# Patient Record
Sex: Female | Born: 1980
Health system: Southern US, Community
[De-identification: ages and names within clinical notes are randomized; demographics above are authoritative.]

## PROBLEM LIST (undated history)

## (undated) ENCOUNTER — Ambulatory Visit: Payer: BC Managed Care – PPO

## (undated) DIAGNOSIS — Z862 Personal history of diseases of the blood and blood-forming organs and certain disorders involving the immune mechanism: Secondary | ICD-10-CM

## (undated) DIAGNOSIS — J45909 Unspecified asthma, uncomplicated: Secondary | ICD-10-CM

## (undated) DIAGNOSIS — B019 Varicella without complication: Secondary | ICD-10-CM

## (undated) HISTORY — DX: Personal history of diseases of the blood and blood-forming organs and certain disorders involving the immune mechanism: Z86.2

## (undated) HISTORY — DX: Varicella without complication: B01.9

## (undated) HISTORY — DX: Unspecified asthma, uncomplicated: J45.909

---

## 1999-08-07 ENCOUNTER — Emergency Department (HOSPITAL_COMMUNITY): Admission: EM | Admit: 1999-08-07 | Discharge: 1999-08-07 | Payer: Self-pay | Admitting: Internal Medicine

## 2000-06-19 ENCOUNTER — Ambulatory Visit (HOSPITAL_BASED_OUTPATIENT_CLINIC_OR_DEPARTMENT_OTHER): Admission: RE | Admit: 2000-06-19 | Discharge: 2000-06-19 | Payer: Self-pay | Admitting: Family Medicine

## 2000-09-13 ENCOUNTER — Encounter: Admission: RE | Admit: 2000-09-13 | Discharge: 2000-12-12 | Payer: Self-pay | Admitting: Internal Medicine

## 2003-02-13 ENCOUNTER — Emergency Department (HOSPITAL_COMMUNITY): Admission: EM | Admit: 2003-02-13 | Discharge: 2003-02-13 | Payer: Self-pay | Admitting: Emergency Medicine

## 2003-10-12 ENCOUNTER — Emergency Department (HOSPITAL_COMMUNITY): Admission: EM | Admit: 2003-10-12 | Discharge: 2003-10-12 | Payer: Self-pay | Admitting: Emergency Medicine

## 2004-03-10 ENCOUNTER — Emergency Department (HOSPITAL_COMMUNITY): Admission: EM | Admit: 2004-03-10 | Discharge: 2004-03-10 | Payer: Self-pay | Admitting: Emergency Medicine

## 2005-05-20 ENCOUNTER — Other Ambulatory Visit: Admission: RE | Admit: 2005-05-20 | Discharge: 2005-05-20 | Payer: Self-pay | Admitting: Family Medicine

## 2006-11-18 ENCOUNTER — Emergency Department (HOSPITAL_COMMUNITY): Admission: EM | Admit: 2006-11-18 | Discharge: 2006-11-19 | Payer: Self-pay | Admitting: Emergency Medicine

## 2009-01-15 ENCOUNTER — Encounter: Admission: RE | Admit: 2009-01-15 | Discharge: 2009-02-12 | Payer: Self-pay | Admitting: Emergency Medicine

## 2009-10-14 ENCOUNTER — Encounter: Admission: RE | Admit: 2009-10-14 | Discharge: 2010-01-12 | Payer: Self-pay | Admitting: Emergency Medicine

## 2010-03-16 ENCOUNTER — Encounter: Admission: RE | Admit: 2010-03-16 | Discharge: 2010-03-16 | Payer: Self-pay | Admitting: Emergency Medicine

## 2015-07-29 ENCOUNTER — Encounter (INDEPENDENT_AMBULATORY_CARE_PROVIDER_SITE_OTHER): Payer: Self-pay

## 2015-07-29 ENCOUNTER — Encounter: Payer: Self-pay | Admitting: Internal Medicine

## 2015-07-29 ENCOUNTER — Ambulatory Visit (INDEPENDENT_AMBULATORY_CARE_PROVIDER_SITE_OTHER): Payer: BC Managed Care – PPO | Admitting: Internal Medicine

## 2015-07-29 VITALS — BP 130/82 | HR 78 | Temp 98.2°F | Ht 64.0 in | Wt 358.0 lb

## 2015-07-29 DIAGNOSIS — L732 Hidradenitis suppurativa: Secondary | ICD-10-CM | POA: Diagnosis not present

## 2015-07-29 DIAGNOSIS — J4520 Mild intermittent asthma, uncomplicated: Secondary | ICD-10-CM | POA: Diagnosis not present

## 2015-07-29 DIAGNOSIS — J45909 Unspecified asthma, uncomplicated: Secondary | ICD-10-CM | POA: Insufficient documentation

## 2015-07-29 NOTE — Progress Notes (Signed)
Pre visit review using our clinic review tool, if applicable. No additional management support is needed unless otherwise documented below in the visit note. 

## 2015-07-29 NOTE — Progress Notes (Signed)
HPI  Pt presents to the clinic today to establish care and for management of the conditions listed below. She has not had a PCP in many years but has been getting care from [facility] urgent care.  Allergy induced asthma: Worse in the Spring. She'll take an antihistamine when the seasons change. She does not have an inhaler.  Hidradennitis Suppurative: She gets cysts under her breast and in her groin. She is supposed to be on daily suppressive therapy with Septra but she reports she does not take it daily.  Obesity: Her BMI is 61.45. She is not working on diet or exercise at this time. Earlier in the year, she was working with [facility] on weight loss, but reports it is harder for her to make appts there since she moved.  Flu: never Tetanus: unsure Pap Smear: 2014- normal Dentist: as needed  Past Medical History  Diagnosis Date  . Allergy-induced asthma   . Chicken pox   . History of anemia     Current Outpatient Prescriptions  Medication Sig Dispense Refill  . sulfamethoxazole-trimethoprim (BACTRIM DS,SEPTRA DS) 800-160 MG tablet Take 1 tablet by mouth 2 (two) times daily. 1 tablet daily and 2 times daily week of menses     No current facility-administered medications for this visit.    Allergies  Allergen Reactions  . Penicillins Hives    Family History  Problem Relation Age of Onset  . Heart disease Mother   . Stroke Mother   . Hypertension Mother   . Heart disease Father   . Stroke Father   . Arthritis Paternal Grandfather     Social History   Social History  . Marital Status: Divorced    Spouse Name: N/A  . Number of Children: N/A  . Years of Education: N/A   Occupational History  . Not on file.   Social History Main Topics  . Smoking status: Never Smoker   . Smokeless tobacco: Never Used  . Alcohol Use: No  . Drug Use: No  . Sexual Activity: Not on file   Other Topics Concern  . Not on file   Social History Narrative  . No  narrative on file    ROS:  Constitutional: Denies fever, malaise, fatigue, headache or abrupt weight changes.  HEENT: Denies eye pain, eye redness, ear pain, ringing in the ears, wax buildup, runny nose, nasal congestion, bloody nose, or sore throat. Respiratory: Denies difficulty breathing, shortness of breath, cough or sputum production.   Cardiovascular: Denies chest pain, chest tightness, palpitations or swelling in the hands or feet.  Skin: Denies redness, rashes, lesions or ulcercations.  Neurological: Denies dizziness, difficulty with memory, difficulty with speech or problems with balance and coordination.  Psych: Denies anxiety, depression, SI/HI.  No other specific complaints in a complete review of systems (except as listed in HPI above).  PE:  BP 130/82 mmHg  Pulse 78  Temp(Src) 98.2 F (36.8 C) (Oral)  Ht  (1.626 m)  Wt 358 lb (162.388 kg)  BMI 61.42 kg/m2  SpO2 99%  LMP 07/21/2015 Wt Readings from Last 3 Encounters:  07/29/15 358 lb (162.388 kg)    General: Appears her stated age, obese in NAD. HEENT: Head: normal shape and size; Eyes: sclera white, no icterus, conjunctiva pink, PERRLA and EOMs intact; Ears: Tm's gray and intact, normal light reflex;Throat/Mouth: Teeth present, mucosa pink and moist, no lesions or ulcerations noted.   Cardiovascular: Normal rate and rhythm. S1,S2 noted.  No murmur, rubs  or gallops noted.  Pulmonary/Chest: Normal effort and positive vesicular breath sounds. No respiratory distress. No wheezes, rales or ronchi noted.  Neurological: Alert and oriented.  Psychiatric: Mood and affect normal. Behavior is normal. Judgment and thought content normal.    Assessment and Plan:  Allergy induced asthma:  Encouraged her to continue antihistamines prn when the seasons change She declines RX for Albuterol to have on hand if needed  Hidradenitis suppurative:  Encouraged her to take the Septra daily as prescribed She will let me know  when this flares up  Obesity:  Encouraged her to work on diet and exercise Will check A1C with annual labs  Make an appt for your annual exam

## 2015-07-29 NOTE — Patient Instructions (Signed)

## 2015-08-28 ENCOUNTER — Ambulatory Visit (INDEPENDENT_AMBULATORY_CARE_PROVIDER_SITE_OTHER): Payer: BC Managed Care – PPO | Admitting: Internal Medicine

## 2015-08-28 ENCOUNTER — Encounter: Payer: Self-pay | Admitting: Internal Medicine

## 2015-08-28 VITALS — BP 126/80 | HR 79 | Temp 98.2°F | Ht 64.0 in | Wt 361.0 lb

## 2015-08-28 DIAGNOSIS — Z0000 Encounter for general adult medical examination without abnormal findings: Secondary | ICD-10-CM | POA: Diagnosis not present

## 2015-08-28 LAB — CBC
HCT: 40.4 % (ref 36.0–46.0)
Hemoglobin: 12.7 g/dL (ref 12.0–15.0)
MCHC: 31.4 g/dL (ref 30.0–36.0)
MCV: 79.7 fl (ref 78.0–100.0)
PLATELETS: 443 10*3/uL — AB (ref 150.0–400.0)
RBC: 5.06 Mil/uL (ref 3.87–5.11)
RDW: 14.7 % (ref 11.5–15.5)
WBC: 6.2 10*3/uL (ref 4.0–10.5)

## 2015-08-28 LAB — COMPREHENSIVE METABOLIC PANEL
ALBUMIN: 3.5 g/dL (ref 3.5–5.2)
ALT: 14 U/L (ref 0–35)
AST: 17 U/L (ref 0–37)
Alkaline Phosphatase: 79 U/L (ref 39–117)
BILIRUBIN TOTAL: 0.4 mg/dL (ref 0.2–1.2)
BUN: 11 mg/dL (ref 6–23)
CALCIUM: 9 mg/dL (ref 8.4–10.5)
CO2: 27 mEq/L (ref 19–32)
CREATININE: 0.79 mg/dL (ref 0.40–1.20)
Chloride: 105 mEq/L (ref 96–112)
GFR: 106.31 mL/min (ref >60.00)
Glucose, Bld: 86 mg/dL (ref 70–99)
Potassium: 3.9 mEq/L (ref 3.5–5.1)
SODIUM: 136 meq/L (ref 135–145)
Total Protein: 7.9 g/dL (ref 6.0–8.3)

## 2015-08-28 LAB — LIPID PANEL
CHOLESTEROL: 137 mg/dL (ref 0–200)
HDL: 42.4 mg/dL (ref >39.00)
LDL CALC: 87 mg/dL (ref 0–99)
NONHDL: 94.82
Total CHOL/HDL Ratio: 3
Triglycerides: 40 mg/dL (ref 0.0–149.0)
VLDL: 8 mg/dL (ref 0.0–40.0)

## 2015-08-28 LAB — HEMOGLOBIN A1C: HEMOGLOBIN A1C: 6.1 % (ref 4.6–6.5)

## 2015-08-28 LAB — TSH: TSH: 1.44 u[IU]/mL (ref 0.35–4.50)

## 2015-08-28 LAB — HIV ANTIBODY (ROUTINE TESTING W REFLEX): HIV: NONREACTIVE

## 2015-08-28 NOTE — Patient Instructions (Signed)
Health Maintenance, Female Adopting a healthy lifestyle and getting preventive care can go a long way to promote health and wellness. Talk with your health care provider about what schedule of regular examinations is right for you. This is a good chance for you to check in with your provider about disease prevention and staying healthy. In between checkups, there are plenty of things you can do on your own. Experts have done a lot of research about which lifestyle changes and preventive measures are most likely to keep you healthy. Ask your health care provider for more information. WEIGHT AND DIET  Eat a healthy diet  Be sure to include plenty of vegetables, fruits, low-fat dairy products, and lean protein.  Do not eat a lot of foods high in solid fats, added sugars, or salt.  Get regular exercise. This is one of the most important things you can do for your health.  Most adults should exercise for at least 150 minutes each week. The exercise should increase your heart rate and make you sweat (moderate-intensity exercise).  Most adults should also do strengthening exercises at least twice a week. This is in addition to the moderate-intensity exercise.  Maintain a healthy weight  Body mass index (BMI) is a measurement that can be used to identify possible weight problems. It estimates body fat based on height and weight. Your health care provider can help determine your BMI and help you achieve or maintain a healthy weight.  For females 20 years of age and older:   A BMI below 18.5 is considered underweight.  A BMI of 18.5 to 24.9 is normal.  A BMI of 25 to 29.9 is considered overweight.  A BMI of 30 and above is considered obese.  Watch levels of cholesterol and blood lipids  You should start having your blood tested for lipids and cholesterol at 35 years of age, then have this test every 5 years.  You may need to have your cholesterol levels checked more often if:  Your lipid  or cholesterol levels are high.  You are older than 35 years of age.  You are at high risk for heart disease.  CANCER SCREENING   Lung Cancer  Lung cancer screening is recommended for adults 55-80 years old who are at high risk for lung cancer because of a history of smoking.  A yearly low-dose CT scan of the lungs is recommended for people who:  Currently smoke.  Have quit within the past 15 years.  Have at least a 30-pack-year history of smoking. A pack year is smoking an average of one pack of cigarettes a day for 1 year.  Yearly screening should continue until it has been 15 years since you quit.  Yearly screening should stop if you develop a health problem that would prevent you from having lung cancer treatment.  Breast Cancer  Practice breast self-awareness. This means understanding how your breasts normally appear and feel.  It also means doing regular breast self-exams. Let your health care provider know about any changes, no matter how small.  If you are in your 20s or 30s, you should have a clinical breast exam (CBE) by a health care provider every 1-3 years as part of a regular health exam.  If you are 40 or older, have a CBE every year. Also consider having a breast X-ray (mammogram) every year.  If you have a family history of breast cancer, talk to your health care provider about genetic screening.  If you   are at high risk for breast cancer, talk to your health care provider about having an MRI and a mammogram every year.  Breast cancer gene (BRCA) assessment is recommended for women who have family members with BRCA-related cancers. BRCA-related cancers include:  Breast.  Ovarian.  Tubal.  Peritoneal cancers.  Results of the assessment will determine the need for genetic counseling and BRCA1 and BRCA2 testing. Cervical Cancer Your health care provider may recommend that you be screened regularly for cancer of the pelvic organs (ovaries, uterus, and  vagina). This screening involves a pelvic examination, including checking for microscopic changes to the surface of your cervix (Pap test). You may be encouraged to have this screening done every 3 years, beginning at age 21.  For women ages 30-65, health care providers may recommend pelvic exams and Pap testing every 3 years, or they may recommend the Pap and pelvic exam, combined with testing for human papilloma virus (HPV), every 5 years. Some types of HPV increase your risk of cervical cancer. Testing for HPV may also be done on women of any age with unclear Pap test results.  Other health care providers may not recommend any screening for nonpregnant women who are considered low risk for pelvic cancer and who do not have symptoms. Ask your health care provider if a screening pelvic exam is right for you.  If you have had past treatment for cervical cancer or a condition that could lead to cancer, you need Pap tests and screening for cancer for at least 20 years after your treatment. If Pap tests have been discontinued, your risk factors (such as having a new sexual partner) need to be reassessed to determine if screening should resume. Some women have medical problems that increase the chance of getting cervical cancer. In these cases, your health care provider may recommend more frequent screening and Pap tests. Colorectal Cancer  This type of cancer can be detected and often prevented.  Routine colorectal cancer screening usually begins at 35 years of age and continues through 35 years of age.  Your health care provider may recommend screening at an earlier age if you have risk factors for colon cancer.  Your health care provider may also recommend using home test kits to check for hidden blood in the stool.  A small camera at the end of a tube can be used to examine your colon directly (sigmoidoscopy or colonoscopy). This is done to check for the earliest forms of colorectal  cancer.  Routine screening usually begins at age 50.  Direct examination of the colon should be repeated every 5-10 years through 35 years of age. However, you may need to be screened more often if early forms of precancerous polyps or small growths are found. Skin Cancer  Check your skin from head to toe regularly.  Tell your health care provider about any new moles or changes in moles, especially if there is a change in a mole's shape or color.  Also tell your health care provider if you have a mole that is larger than the size of a pencil eraser.  Always use sunscreen. Apply sunscreen liberally and repeatedly throughout the day.  Protect yourself by wearing long sleeves, pants, a wide-brimmed hat, and sunglasses whenever you are outside. HEART DISEASE, DIABETES, AND HIGH BLOOD PRESSURE   High blood pressure causes heart disease and increases the risk of stroke. High blood pressure is more likely to develop in:  People who have blood pressure in the high end   of the normal range (130-139/85-89 mm Hg).  People who are overweight or obese.  People who are African American.  If you are 38-23 years of age, have your blood pressure checked every 3-5 years. If you are 61 years of age or older, have your blood pressure checked every year. You should have your blood pressure measured twice--once when you are at a hospital or clinic, and once when you are not at a hospital or clinic. Record the average of the two measurements. To check your blood pressure when you are not at a hospital or clinic, you can use:  An automated blood pressure machine at a pharmacy.  A home blood pressure monitor.  If you are between 45 years and 39 years old, ask your health care provider if you should take aspirin to prevent strokes.  Have regular diabetes screenings. This involves taking a blood sample to check your fasting blood sugar level.  If you are at a normal weight and have a low risk for diabetes,  have this test once every three years after 35 years of age.  If you are overweight and have a high risk for diabetes, consider being tested at a younger age or more often. PREVENTING INFECTION  Hepatitis B  If you have a higher risk for hepatitis B, you should be screened for this virus. You are considered at high risk for hepatitis B if:  You were born in a country where hepatitis B is common. Ask your health care provider which countries are considered high risk.  Your parents were born in a high-risk country, and you have not been immunized against hepatitis B (hepatitis B vaccine).  You have HIV or AIDS.  You use needles to inject street drugs.  You live with someone who has hepatitis B.  You have had sex with someone who has hepatitis B.  You get hemodialysis treatment.  You take certain medicines for conditions, including cancer, organ transplantation, and autoimmune conditions. Hepatitis C  Blood testing is recommended for:  Everyone born from [birth year] through 1966.  Anyone with known risk factors for hepatitis C. Sexually transmitted infections (STIs)  You should be screened for sexually transmitted infections (STIs) including gonorrhea and chlamydia if:  You are sexually active and are younger than 35 years of age.  You are older than 35 years of age and your health care provider tells you that you are at risk for this type of infection.  Your sexual activity has changed since you were last screened and you are at an increased risk for chlamydia or gonorrhea. Ask your health care provider if you are at risk.  If you do not have HIV, but are at risk, it may be recommended that you take a prescription medicine daily to prevent HIV infection. This is called pre-exposure prophylaxis (PrEP). You are considered at risk if:  You are sexually active and do not regularly use condoms or know the HIV status of your partner(s).  You take drugs by injection.  You are sexually  active with a partner who has HIV. Talk with your health care provider about whether you are at high risk of being infected with HIV. If you choose to begin PrEP, you should first be tested for HIV. You should then be tested every 3 months for as long as you are taking PrEP.  PREGNANCY   If you are premenopausal and you may become pregnant, ask your health care provider about preconception counseling.  If you may  become pregnant, take 400 to 800 micrograms (mcg) of folic acid every day.  If you want to prevent pregnancy, talk to your health care provider about birth control (contraception). OSTEOPOROSIS AND MENOPAUSE   Osteoporosis is a disease in which the bones lose minerals and strength with aging. This can result in serious bone fractures. Your risk for osteoporosis can be identified using a bone density scan.  If you are 61 years of age or older, or if you are at risk for osteoporosis and fractures, ask your health care provider if you should be screened.  Ask your health care provider whether you should take a calcium or vitamin D supplement to lower your risk for osteoporosis.  Menopause may have certain physical symptoms and risks.  Hormone replacement therapy may reduce some of these symptoms and risks. Talk to your health care provider about whether hormone replacement therapy is right for you.  HOME CARE INSTRUCTIONS   Schedule regular health, dental, and eye exams.  Stay current with your immunizations.   Do not use any tobacco products including cigarettes, chewing tobacco, or electronic cigarettes.  If you are pregnant, do not drink alcohol.  If you are breastfeeding, limit how much and how often you drink alcohol.  Limit alcohol intake to no more than 1 drink per day for nonpregnant women. One drink equals 12 ounces of beer, 5 ounces of wine, or 1 ounces of hard liquor.  Do not use street drugs.  Do not share needles.  Ask your health care provider for help if  you need support or information about quitting drugs.  Tell your health care provider if you often feel depressed.  Tell your health care provider if you have ever been abused or do not feel safe at home.   This information is not intended to replace advice given to you by your health care provider. Make sure you discuss any questions you have with your health care provider.   Document Released: 03/08/2011 Document Revised: 09/13/2014 Document Reviewed: 07/25/2013 Elsevier Interactive Patient Education [employer].

## 2015-08-28 NOTE — Progress Notes (Signed)
Pre visit review using our clinic review tool, if applicable. No additional management support is needed unless otherwise documented below in the visit note. 

## 2015-08-28 NOTE — Progress Notes (Signed)
Subjective:    Patient ID: Shelia Diaz, female    DOB: [birth year], 34 y.o.   MRN: [id number]  HPI  Pt presents to the clinic today for her annual exam.  Flu: 06/2015 Tetanus: unsure Pap Smear: 2014- normal Dentist: as needed  Diet: She does eat meat. She consumes fruits and veggies a few days per week. She tries to avoid fried foods. She drinks mostly water, flavored water or diet soda. Exercise: None  Review of Systems      Past Medical History  Diagnosis Date  . Allergy-induced asthma   . Chicken pox   . History of anemia     Current Outpatient Prescriptions  Medication Sig Dispense Refill  . sulfamethoxazole-trimethoprim (BACTRIM DS,SEPTRA DS) 800-160 MG tablet Take 1 tablet by mouth 2 (two) times daily. 1 tablet daily and 2 times daily week of menses     No current facility-administered medications for this visit.    Allergies  Allergen Reactions  . Penicillins Hives    Family History  Problem Relation Age of Onset  . Heart disease Mother   . Stroke Mother   . Hypertension Mother   . Heart disease Father   . Stroke Father   . Arthritis Paternal Grandfather     Social History   Social History  . Marital Status: Divorced    Spouse Name: N/A  . Number of Children: N/A  . Years of Education: N/A   Occupational History  . Not on file.   Social History Main Topics  . Smoking status: Never Smoker   . Smokeless tobacco: Never Used  . Alcohol Use: No  . Drug Use: No  . Sexual Activity: Yes   Other Topics Concern  . Not on file   Social History Narrative     Constitutional: Denies fever, malaise, fatigue, headache or abrupt weight changes.  HEENT: Denies eye pain, eye redness, ear pain, ringing in the ears, wax buildup, runny nose, nasal congestion, bloody nose, or sore throat. Respiratory: Denies difficulty breathing, shortness of breath, cough or sputum production.   Cardiovascular: Denies chest pain, chest tightness, palpitations or  swelling in the hands or feet.  Gastrointestinal: Denies abdominal pain, bloating, constipation, diarrhea or blood in the stool.  GU: Denies urgency, frequency, pain with urination, burning sensation, blood in urine, odor or discharge. Musculoskeletal: Denies decrease in range of motion, difficulty with gait, muscle pain or joint pain and swelling.  Skin: Pt reports hidradenitis. Denies redness, rashes or ulcercations.  Neurological: Denies dizziness, difficulty with memory, difficulty with speech or problems with balance and coordination.  Psych: Denies anxiety, depression, SI/HI.  No other specific complaints in a complete review of systems (except as listed in HPI above).  Objective:   Physical Exam   BP 126/80 mmHg  Pulse 79  Temp(Src) 98.2 F (36.8 C) (Oral)  Ht  (1.626 m)  Wt 361 lb (163.749 kg)  BMI 61.94 kg/m2  SpO2 98%  LMP 08/21/2015 Wt Readings from Last 3 Encounters:  08/28/15 361 lb (163.749 kg)  07/29/15 358 lb (162.388 kg)    General: Appears her stated age, obese in NAD. Skin: Warm, dry and intact.  HEENT: Head: normal shape and size; Eyes: sclera white, no icterus, conjunctiva pink, PERRLA and EOMs intact; Ears: Tm's gray and intact, normal light reflex; Throat/Mouth: Teeth present, mucosa pink and moist, no exudate, lesions or ulcerations noted.  Neck:  Neck supple, trachea midline. No masses, lumps or thyromegaly present.  Cardiovascular: Normal rate  and rhythm. S1,S2 noted.  No murmur, rubs or gallops noted.  Pulmonary/Chest: Normal effort and positive vesicular breath sounds. No respiratory distress. No wheezes, rales or ronchi noted.  Abdomen: Soft and nontender. Normal bowel sounds. No distention or masses noted. Liver, spleen and kidneys non palpable. Musculoskeletal: Strength 5/5 BUE/BLE. No signs of joint swelling. No difficulty with gait.  Neurological: Alert and oriented. Cranial nerves II-XII grossly  intact. Coordination normal.  Psychiatric:  Mood and affect normal. Behavior is normal. Judgment and thought content normal.       Assessment & Plan:   Preventative Health Maintenance:  Flu shot UTD Tetanus today Encouraged her to consume a balanced diet and start an exercise regimen Pap smear due 2017 Encouraged her to see a dentist at least annually Will check CBC, CMET, TSH, Lipid, A1C and HIV  RTC in 1 year or sooner if needed

## 2015-10-01 ENCOUNTER — Other Ambulatory Visit: Payer: Self-pay | Admitting: Physician Assistant

## 2015-10-01 DIAGNOSIS — K449 Diaphragmatic hernia without obstruction or gangrene: Secondary | ICD-10-CM

## 2015-10-07 ENCOUNTER — Other Ambulatory Visit: Payer: BC Managed Care – PPO

## 2015-11-17 ENCOUNTER — Ambulatory Visit
Admission: RE | Admit: 2015-11-17 | Discharge: 2015-11-17 | Disposition: A | Discharge status: Home / Self Care | Payer: BC Managed Care – PPO | Source: Ambulatory Visit | Attending: Physician Assistant | Admitting: Physician Assistant

## 2015-11-17 DIAGNOSIS — K449 Diaphragmatic hernia without obstruction or gangrene: Secondary | ICD-10-CM

## 2016-03-16 HISTORY — PX: LAPAROSCOPIC GASTRIC SLEEVE RESECTION: SHX5895

## 2016-05-14 ENCOUNTER — Encounter: Payer: Self-pay | Admitting: Internal Medicine

## 2016-05-14 ENCOUNTER — Ambulatory Visit (INDEPENDENT_AMBULATORY_CARE_PROVIDER_SITE_OTHER): Payer: BC Managed Care – PPO | Admitting: Internal Medicine

## 2016-05-14 VITALS — BP 122/70 | HR 78 | Temp 98.6°F | Wt 316.5 lb

## 2016-05-14 DIAGNOSIS — T162XXA Foreign body in left ear, initial encounter: Secondary | ICD-10-CM

## 2016-05-14 NOTE — Progress Notes (Signed)
Subjective:    Patient ID: Shelia Diaz, female    DOB: [birth year], 35 y.o.   MRN: [id number]  HPI  Pt presents to the clinic today with c/o having a tip of a Qtip stuck in her left ear. She reports she was itching her ear with the Qtip yesterday, and when she pulled it out, the cotton was not on the Qtip. This morning, she woke up and was not able to hear out of her left ear. She has not tried anything OTC for this.   Review of Systems      Past Medical History:  Diagnosis Date  . Allergy-induced asthma   . Chicken pox   . History of anemia     Current Outpatient Prescriptions  Medication Sig Dispense Refill  . sulfamethoxazole-trimethoprim (BACTRIM DS,SEPTRA DS) 800-160 MG tablet Take 1 tablet by mouth as needed.    . ursodiol (ACTIGALL) 300 MG capsule Take 300 mg by mouth 3 (three) times daily.     No current facility-administered medications for this visit.     Allergies  Allergen Reactions  . Penicillins Hives    Family History  Problem Relation Age of Onset  . Heart disease Mother   . Stroke Mother   . Hypertension Mother   . Heart disease Father   . Stroke Father   . Arthritis Paternal Grandfather     Social History   Social History  . Marital status: Divorced    Spouse name: N/A  . Number of children: N/A  . Years of education: N/A   Occupational History  . Not on file.   Social History Main Topics  . Smoking status: Never Smoker  . Smokeless tobacco: Never Used  . Alcohol use No  . Drug use: No  . Sexual activity: Yes   Other Topics Concern  . Not on file   Social History Narrative  . No narrative on file     Constitutional: Denies fever, malaise, fatigue, headache or abrupt weight changes.  HEENT: Pt reports foreign object in left ear. Denies eye pain, eye redness, ear pain, ringing in the ears, wax buildup, runny nose, nasal congestion, bloody nose, or sore throat.  No other specific complaints in a complete review of systems  (except as listed in HPI above).  Objective:   Physical Exam  BP 122/70   Pulse 78   Temp 98.6 F (37 C) (Oral)   Wt (!) 316 lb 8 oz (143.6 kg)   SpO2 98%   BMI 54.33 kg/m  Wt Readings from Last 3 Encounters:  05/14/16 (!) 316 lb 8 oz (143.6 kg)  08/28/15 (!) 361 lb (163.7 kg)  07/29/15 (!) 358 lb (162.4 kg)    General: Appears her stated age, obese in NAD. HEENT: Left Ears: Unable to visualize TM due to cotton impacting the ear. Once removed, TM pink but intact, normal light reflex.  BMET    Component Value Date/Time   NA 136 08/28/2015 1440   K 3.9 08/28/2015 1440   CL 105 08/28/2015 1440   CO2 27 08/28/2015 1440   GLUCOSE 86 08/28/2015 1440   BUN 11 08/28/2015 1440   CREATININE 0.79 08/28/2015 1440   CALCIUM 9.0 08/28/2015 1440    Lipid Panel     Component Value Date/Time   CHOL 137 08/28/2015 1440   TRIG 40.0 08/28/2015 1440   HDL 42.40 08/28/2015 1440   CHOLHDL 3 08/28/2015 1440   VLDL 8.0 08/28/2015 1440   LDLCALC 87 08/28/2015  1440    CBC    Component Value Date/Time   WBC 6.2 08/28/2015 1440   RBC 5.06 08/28/2015 1440   HGB 12.7 08/28/2015 1440   HCT 40.4 08/28/2015 1440   PLT 443.0 (H) 08/28/2015 1440   MCV 79.7 08/28/2015 1440   MCHC 31.4 08/28/2015 1440   RDW 14.7 08/28/2015 1440    Hgb A1C Lab Results  Component Value Date   HGBA1C 6.1 08/28/2015            Assessment & Plan:   Foreign object left ear:  Removed by provider using alligator clamps Advised her not to use Qtips Return precautions discussed  RTC as needed or if symptoms persist or worsen Carron Jaggi, NP

## 2016-05-16 ENCOUNTER — Encounter: Payer: Self-pay | Admitting: Internal Medicine

## 2016-05-16 NOTE — Patient Instructions (Signed)
Ear Foreign Body °An ear foreign body is an object that is stuck in your ear. The object is usually stuck in the ear canal. °CAUSES °In all ages of people, the most common foreign bodies are insects that enter the ear canal. It is common for young children to put objects into the ear canal. These may include pebbles, beads, parts of toys, and any other small objects that fit into the ear. In adults, objects such as cotton swabs may become lodged in the ear canal.  °SIGNS AND SYMPTOMS °A foreign body in the ear may cause: °· Pain. °· Buzzing or roaring sounds. °· Hearing loss. °· Ear drainage or bleeding. °· Nausea and vomiting. °· A feeling that your ear is full. °DIAGNOSIS °Your health care provider may be able to diagnose an ear foreign body based on the information that you provide, your symptoms, and a physical exam. Your health care provider may also perform tests, such as testing your hearing and your ear pressure, to check for infection or other problems that are caused by the foreign body in your ear. °TREATMENT °Treatment depends on what the foreign body is, the location of the foreign body in your ear, and whether or not the foreign body has injured any part of your inner ear. If the foreign body is visible to your health care provider, it may be possible to remove the foreign body using: °· A tool, such as medical tweezers (forceps) or a suction tube (catheter). °· Irrigation. This uses water to flush the foreign body out of your ear. This is used only if the foreign body is not likely to swell or enlarge when it is put in water. °If the foreign body is not visible or your health care provider was not able to remove the foreign body, you may be referred to a specialist for removal. You may also be prescribed antibiotic medicine or ear drops to prevent infection. If the foreign body has caused injury to other parts of your ear, you may need additional treatment. °HOME CARE INSTRUCTIONS °· Keep all  follow-up visits as directed by your health care provider. This is important. °· Take medicines only as directed by your health care provider. °· If you were prescribed an antibiotic medicine, finish it all even if you start to feel better. °PREVENTION °· Keep small objects out of reach of young children. Tell children not to put anything in their ears. °· Do not put anything in your ear, including cotton swabs, to clean your ears. Talk to your health care provider about how to clean your ears safely. °SEEK MEDICAL CARE IF: °· You have a headache. °· Your have blood coming from your ear. °· You have a fever. °· You have increased pain or swelling of your ear. °· Your hearing is reduced. °· You have discharge coming from your ear. °  °This information is not intended to replace advice given to you by your health care provider. Make sure you discuss any questions you have with your health care provider. °  °Document Released: 08/20/2000 Document Revised: 09/13/2014 Document Reviewed: 04/08/2014 °Elsevier Interactive Patient Education ©2016 Else[employer] Inc[employer] ° °

## 2017-02-25 ENCOUNTER — Ambulatory Visit (INDEPENDENT_AMBULATORY_CARE_PROVIDER_SITE_OTHER): Payer: BC Managed Care – PPO | Admitting: Internal Medicine

## 2017-02-25 ENCOUNTER — Encounter: Payer: Self-pay | Admitting: Internal Medicine

## 2017-02-25 VITALS — BP 120/80 | HR 70 | Temp 98.2°F | Wt 286.0 lb

## 2017-02-25 DIAGNOSIS — L02211 Cutaneous abscess of abdominal wall: Secondary | ICD-10-CM

## 2017-02-25 MED ORDER — SULFAMETHOXAZOLE-TRIMETHOPRIM 800-160 MG PO TABS
1.0000 | ORAL_TABLET | Freq: Two times a day (BID) | ORAL | 0 refills | Status: DC
Start: 2017-02-25 — End: 2017-06-20

## 2017-02-25 MED ORDER — SULFAMETHOXAZOLE-TRIMETHOPRIM 800-160 MG PO TABS: 1.0000 | ORAL_TABLET | Freq: Every day | ORAL | 2 refills | Status: DC

## 2017-02-25 NOTE — Progress Notes (Signed)
Subjective:    Patient ID: Shelia Diaz, female    DOB: [birth year], 36 y.o.   MRN: [id number]  HPI  Pt presents to the clinic today with c/o a cyst under her left breast. She noticed this 3 days ago. It is tender, mildly red and warm. It has drained a little bit of pus. She denies fever, chills or body aches.  She has tried warm compresses with minimal relief. She used to be on Bactrim daily, but reports she stopped it 1 month ago. She is requesting refills of the Bactrim today.  Review of Systems      Past Medical History:  Diagnosis Date  . Allergy-induced asthma   . Chicken pox   . History of anemia     Current Outpatient Prescriptions  Medication Sig Dispense Refill  . sulfamethoxazole-trimethoprim (BACTRIM DS,SEPTRA DS) 800-160 MG tablet Take 1 tablet by mouth as needed.    . ursodiol (ACTIGALL) 300 MG capsule Take 300 mg by mouth 3 (three) times daily.     No current facility-administered medications for this visit.     Allergies  Allergen Reactions  . Penicillins Hives    Family History  Problem Relation Age of Onset  . Heart disease Mother   . Stroke Mother   . Hypertension Mother   . Heart disease Father   . Stroke Father   . Arthritis Paternal Grandfather     Social History   Social History  . Marital status: Divorced    Spouse name: N/A  . Number of children: N/A  . Years of education: N/A   Occupational History  . Not on file.   Social History Main Topics  . Smoking status: Never Smoker  . Smokeless tobacco: Never Used  . Alcohol use No  . Drug use: No  . Sexual activity: Yes   Other Topics Concern  . Not on file   Social History Narrative  . No narrative on file     Constitutional: Denies fever, malaise, fatigue, headache or abrupt weight changes.  Skin: Pt reports lesion under left breast. Denies rashes, or ulcercations.    No other specific complaints in a complete review of systems (except as listed in HPI  above).  Objective:   Physical Exam   BP 120/80   Pulse 70   Temp 98.2 F (36.8 C) (Oral)   Wt 286 lb (129.7 kg)   LMP 02/11/2017   SpO2 98%   BMI 49.09 kg/m  Wt Readings from Last 3 Encounters:  02/25/17 286 lb (129.7 kg)  05/14/16 (!) 316 lb 8 oz (143.6 kg)  08/28/15 (!) 361 lb (163.7 kg)    General: Appears her stated age, obese in NAD. Skin: 3 cm oval abscess noted on left upper abdomen. Surround induration.  BMET    Component Value Date/Time   NA 136 08/28/2015 1440   K 3.9 08/28/2015 1440   CL 105 08/28/2015 1440   CO2 27 08/28/2015 1440   GLUCOSE 86 08/28/2015 1440   BUN 11 08/28/2015 1440   CREATININE 0.79 08/28/2015 1440   CALCIUM 9.0 08/28/2015 1440    Lipid Panel     Component Value Date/Time   CHOL 137 08/28/2015 1440   TRIG 40.0 08/28/2015 1440   HDL 42.40 08/28/2015 1440   CHOLHDL 3 08/28/2015 1440   VLDL 8.0 08/28/2015 1440   LDLCALC 87 08/28/2015 1440    CBC    Component Value Date/Time   WBC 6.2 08/28/2015 1440   RBC  5.06 08/28/2015 1440   HGB 12.7 08/28/2015 1440   HCT 40.4 08/28/2015 1440   PLT 443.0 (H) 08/28/2015 1440   MCV 79.7 08/28/2015 1440   MCHC 31.4 08/28/2015 1440   RDW 14.7 08/28/2015 1440    Hgb A1C Lab Results  Component Value Date   HGBA1C 6.1 08/28/2015           Assessment & Plan:   Abscess of Skin of Abdomen:  Pus expressed Continue warm compresses TID eRx for Bactrim BID x 10 days then daily therafter  Return precautions discussed Nicki ReaperBAITY, Hazel Leveille, NP

## 2017-02-25 NOTE — Patient Instructions (Signed)
Hidradenitis Suppurativa Hidradenitis suppurativa is a long-term (chronic) skin disease that starts with blocked sweat glands or hair follicles. Bacteria may grow in these blocked openings of your skin. Hidradenitis suppurativa is like a severe form of acne that develops in areas of your body where acne would be unusual. It is most likely to affect the areas of your body where skin rubs against skin and becomes moist. This includes your:  Underarms.  Groin.  Genital areas.  Buttocks.  Upper thighs.  Breasts.  Hidradenitis suppurativa may start out with small pimples. The pimples can develop into deep sores that break open (rupture) and drain pus. Over time your skin may thicken and become scarred. Hidradenitis suppurativa cannot be passed from person to person. What are the causes? The exact cause of hidradenitis suppurativa is not known. This condition may be due to:  Female and female hormones. The condition is rare before and after puberty.  An overactive body defense system (immune system). Your immune system may overreact to the blocked hair follicles or sweat glands and cause swelling and pus-filled sores.  What increases the risk? You may have a higher risk of hidradenitis suppurativa if you:  Are a woman.  Are between ages 11 and 55.  Have a family history of hidradenitis suppurativa.  Have a personal history of acne.  Are overweight.  Smoke.  Take the drug lithium.  What are the signs or symptoms? The first signs of an outbreak are usually painful skin bumps that look like pimples. As the condition progresses:  Skin bumps may get bigger and grow deeper into the skin.  Bumps under the skin may rupture and drain smelly pus.  Skin may become itchy and infected.  Skin may thicken and scar.  Drainage may continue through tunnels under the skin (fistulas).  Walking and moving your arms can become painful.  How is this diagnosed? Your health care provider may  diagnose hidradenitis suppurativa based on your medical history and your signs and symptoms. A physical exam will also be done. You may need to see a health care provider who specializes in skin diseases (dermatologist). You may also have tests done to confirm the diagnosis. These can include:  Swabbing a sample of pus or drainage from your skin so it can be sent to the lab and tested for infection.  Blood tests to check for infection.  How is this treated? The same treatment will not work for everybody with hidradenitis suppurativa. Your treatment will depend on how severe your symptoms are. You may need to try several treatments to find what works best for you. Part of your treatment may include cleaning and bandaging (dressing) your wounds. You may also have to take medicines, such as the following:  Antibiotics.  Acne medicines.  Medicines to block or suppress the immune system.  A diabetes medicine (metformin) is sometimes used to treat this condition.  For women, birth control pills can sometimes help relieve symptoms.  You may need surgery if you have a severe case of hidradenitis suppurativa that does not respond to medicine. Surgery may involve:  Using a laser to clear the skin and remove hair follicles.  Opening and draining deep sores.  Removing the areas of skin that are diseased and scarred.  Follow these instructions at home:  Learn as much as you can about your disease, and work closely with your health care providers.  Take medicines only as directed by your health care provider.  If you were prescribed   an antibiotic medicine, finish it all even if you start to feel better.  If you are overweight, losing weight may be very helpful. Try to reach and maintain a healthy weight.  Do not use any tobacco products, including cigarettes, chewing tobacco, or electronic cigarettes. If you need help quitting, ask your health care provider.  Do not shave the areas where you  get hidradenitis suppurativa.  Do not wear deodorant.  Wear loose-fitting clothes.  Try not to overheat and get sweaty.  Take a daily bleach bath as directed by your health care provider. ? Fill your bathtub halfway with water. ? Pour in  cup of unscented household bleach. ? Soak for 5-10 minutes.  Cover sore areas with a warm, clean washcloth (compress) for 5-10 minutes. Contact a health care provider if:  You have a flare-up of hidradenitis suppurativa.  You have chills or a fever.  You are having trouble controlling your symptoms at home. This information is not intended to replace advice given to you by your health care provider. Make sure you discuss any questions you have with your health care provider. Document Released: 04/06/2004 Document Revised: 01/29/2016 Document Reviewed: 11/23/2013 Elsevier Interactive Patient Education  2018 Elsevier Inc.  

## 2017-06-20 ENCOUNTER — Encounter: Payer: Self-pay | Admitting: Internal Medicine

## 2017-06-20 ENCOUNTER — Ambulatory Visit (INDEPENDENT_AMBULATORY_CARE_PROVIDER_SITE_OTHER): Payer: BC Managed Care – PPO | Admitting: Internal Medicine

## 2017-06-20 VITALS — BP 122/80 | HR 64 | Temp 98.1°F | Wt 290.0 lb

## 2017-06-20 DIAGNOSIS — M7632 Iliotibial band syndrome, left leg: Secondary | ICD-10-CM

## 2017-06-20 NOTE — Progress Notes (Signed)
Subjective:    Patient ID: Shelia Diaz, female    DOB: 11-Jun-1981, 36 y.o.   MRN: [id number]  HPI  Pt presents to the clinic today with c/o left knee pain. She reports this started 2-3 weeks ago. She describes the pain as sharp. The pain radiates up the outer part of her left thigh. She reports a pulling/tight sensation. She denies back, hip or ankle pain on the left side. She denies numbness or tingling in her leg. She denies any injury to the area but reports she has been working out a lot. She has tried Aleve, ice and elevation with some relief.   Review of Systems      Past Medical History:  Diagnosis Date  . Allergy-induced asthma   . Chicken pox   . History of anemia     Current Outpatient Prescriptions  Medication Sig Dispense Refill  . sulfamethoxazole-trimethoprim (BACTRIM DS,SEPTRA DS) 800-160 MG tablet Take 1 tablet by mouth 2 (two) times daily. 20 tablet 0  . sulfamethoxazole-trimethoprim (BACTRIM DS,SEPTRA DS) 800-160 MG tablet Take 1 tablet by mouth daily. 30 tablet 2   No current facility-administered medications for this visit.     Allergies  Allergen Reactions  . Penicillins Hives    Family History  Problem Relation Age of Onset  . Heart disease Mother   . Stroke Mother   . Hypertension Mother   . Heart disease Father   . Stroke Father   . Arthritis Paternal Grandfather     Social History   Social History  . Marital status: Divorced    Spouse name: N/A  . Number of children: N/A  . Years of education: N/A   Occupational History  . Not on file.   Social History Main Topics  . Smoking status: Never Smoker  . Smokeless tobacco: Never Used  . Alcohol use No  . Drug use: No  . Sexual activity: Yes   Other Topics Concern  . Not on file   Social History Narrative  . No narrative on file     Constitutional: Denies fever, malaise, fatigue, headache or abrupt weight changes.  Musculoskeletal: Pt reports knee pain. Denies decrease in  range of motion, difficulty with gait, muscle pain or joint swelling.   No other specific complaints in a complete review of systems (except as listed in HPI above).  Objective:   Physical Exam   Wt Readings from Last 3 Encounters:  06/20/17 290 lb (131.5 kg)  02/25/17 286 lb (129.7 kg)  05/14/16 (!) 316 lb 8 oz (143.6 kg)    General: Appears her stated age, obese in NAD. Musculoskeletal: Normal flexion and extension of the left knee. No joint swelling noted. No pain with palpation of the left knee. Pain with palpation of the left IT band. Negative Lachman's. Negative McMurray.  Normal gait.   BMET    Component Value Date/Time   NA 136 08/28/2015 1440   K 3.9 08/28/2015 1440   CL 105 08/28/2015 1440   CO2 27 08/28/2015 1440   GLUCOSE 86 08/28/2015 1440   BUN 11 08/28/2015 1440   CREATININE 0.79 08/28/2015 1440   CALCIUM 9.0 08/28/2015 1440    Lipid Panel     Component Value Date/Time   CHOL 137 08/28/2015 1440   TRIG 40.0 08/28/2015 1440   HDL 42.40 08/28/2015 1440   CHOLHDL 3 08/28/2015 1440   VLDL 8.0 08/28/2015 1440   LDLCALC 87 08/28/2015 1440    CBC    Component  Value Date/Time   WBC 6.2 08/28/2015 1440   RBC 5.06 08/28/2015 1440   HGB 12.7 08/28/2015 1440   HCT 40.4 08/28/2015 1440   PLT 443.0 (H) 08/28/2015 1440   MCV 79.7 08/28/2015 1440   MCHC 31.4 08/28/2015 1440   RDW 14.7 08/28/2015 1440    Hgb A1C Lab Results  Component Value Date   HGBA1C 6.1 08/28/2015            Assessment & Plan:   IT Band Tendonitis:  Advised her she needs to cut out repetitive motions at the gym for a while Take Aleve BID x 1 week Continue ice and elevation If no improvement, can try Pred Burst vs PT  Return precautions discussed Nicki Reaper, NP

## 2017-06-20 NOTE — Patient Instructions (Signed)
Tendinitis Tendinitis is inflammation of a tendon. A tendon is a strong cord of tissue that connects muscle to bone. Tendinitis can affect any tendon, but it most commonly affects the shoulder tendon (rotator cuff), ankle tendon (Achilles tendon), elbow tendon (triceps tendon), or one of the tendons in the wrist. What are the causes? This condition may be caused by:  Overusing a tendon or muscle. This is common.  Age-related wear and tear.  Injury.  Inflammatory conditions, such as arthritis.  Certain medicines.  What increases the risk? This condition is more likely to develop in people who do activities that involve repetitive motions. What are the signs or symptoms? Symptoms of this condition may include:  Pain.  Tenderness.  Mild swelling.  How is this diagnosed? This condition is diagnosed with a physical exam. You may also have tests, such as:  Ultrasound. This uses sound waves to make an image of your affected area.  MRI.  How is this treated? This condition may be treated by resting, icing, applying pressure (compression), and raising (elevating) the area above the level of your heart. This is known as RICE therapy. Treatment may also include:  Medicines to help reduce inflammation or to help reduce pain.  Exercises or physical therapy to strengthen and stretch the tendon.  A brace or splint.  Surgery (rare).  Follow these instructions at home:  If you have a splint or brace:  Wear the splint or brace as told by your health care provider. Remove it only as told by your health care provider.  Loosen the splint or brace if your fingers or toes tingle, become numb, or turn cold and blue.  Do not take baths, swim, or use a hot tub until your health care provider approves. Ask your health care provider if you can take showers. You may only be allowed to take sponge baths for bathing.  Do not let your splint or brace get wet if it is not waterproof. ? If your  splint or brace is not waterproof, cover it with a watertight plastic bag when you take a bath or a shower.  Keep the splint or brace clean. Managing pain, stiffness, and swelling  If directed, apply ice to the affected area. ? Put ice in a plastic bag. ? Place a towel between your skin and the bag. ? Leave the ice on for 20 minutes, 2-3 times a day.  If directed, apply heat to the affected area as often as told by your health care provider. Use the heat source that your health care provider recommends, such as a moist heat pack or a heating pad. ? Place a towel between your skin and the heat source. ? Leave the heat on for 20-30 minutes. ? Remove the heat if your skin turns bright red. This is especially important if you are unable to feel pain, heat, or cold. You may have a greater risk of getting burned.  Move the fingers or toes of the affected limb often, if this applies. This can help to prevent stiffness and lessen swelling.  If directed, elevate the affected area above the level of your heart while you are sitting or lying down. Driving  Do not drive or operate heavy machinery while taking prescription pain medicine.  Ask your health care provider when it is safe to drive if you have a splint or brace on any part of your arm or leg. Activity  Return to your normal activities as told by your health care   provider. Ask your health care provider what activities are safe for you.  Rest the affected area as told by your health care provider.  Avoid using the affected area while you are experiencing symptoms of tendinitis.  Do exercises as told by your health care provider. General instructions  If you have a splint, do not put pressure on any part of the splint until it is fully hardened. This may take several hours.  Wear an elastic bandage or compression wrap only as told by your health care provider.  Take over-the-counter and prescription medicines only as told by your  health care provider.  Keep all follow-up visits as told by your health care provider. This is important. Contact a health care provider if:  Your symptoms do not improve.  You develop new, unexplained problems, such as numbness in your hands. This information is not intended to replace advice given to you by your health care provider. Make sure you discuss any questions you have with your health care provider. Document Released: 08/20/2000 Document Revised: 04/22/2016 Document Reviewed: 05/26/2015 Elsevier Interactive Patient Education  2018 [employer] Inc.  

## 2017-07-18 ENCOUNTER — Other Ambulatory Visit (HOSPITAL_COMMUNITY)
Admission: RE | Admit: 2017-07-18 | Discharge: 2017-07-18 | Disposition: A | Discharge status: Home / Self Care | Payer: BC Managed Care – PPO | Source: Ambulatory Visit | Attending: Internal Medicine | Admitting: Internal Medicine

## 2017-07-18 ENCOUNTER — Ambulatory Visit (INDEPENDENT_AMBULATORY_CARE_PROVIDER_SITE_OTHER): Payer: BC Managed Care – PPO | Admitting: Internal Medicine

## 2017-07-18 ENCOUNTER — Encounter: Payer: Self-pay | Admitting: Internal Medicine

## 2017-07-18 VITALS — BP 120/80 | HR 81 | Temp 98.3°F | Ht 64.0 in | Wt 281.0 lb

## 2017-07-18 DIAGNOSIS — Z124 Encounter for screening for malignant neoplasm of cervix: Secondary | ICD-10-CM

## 2017-07-18 DIAGNOSIS — Z0001 Encounter for general adult medical examination with abnormal findings: Secondary | ICD-10-CM | POA: Diagnosis not present

## 2017-07-18 NOTE — Assessment & Plan Note (Signed)
S/p gastric sleeve She continues to lose weight Encouraged her to work on diet and exercise

## 2017-07-18 NOTE — Patient Instructions (Signed)

## 2017-07-18 NOTE — Progress Notes (Signed)
Subjective:    Patient ID: Shelia Diaz, female    DOB: Jan 09, 1981, 36 y.o.   MRN: [id number]  HPI  Pt presents to the clinic today for her annual exam.  Flu: 06/2015 Tetanus: 08/2015 Pap Smear: 2014 Dentist: as needed  Diet: She does eat meat. She consumes more veggies than fruits. She does eat some fried foods. She drinks mostly water. Exercise: She works out with a Systems analystpersonal trainer 2-3 times per week, Zumba 2-3 times per week.  Review of Systems      Past Medical History:  Diagnosis Date  . Allergy-induced asthma   . Chicken pox   . History of anemia     No current outpatient medications on file.   No current facility-administered medications for this visit.     Allergies  Allergen Reactions  . Penicillins Hives    Family History  Problem Relation Age of Onset  . Heart disease Mother   . Stroke Mother   . Hypertension Mother   . Heart disease Father   . Stroke Father   . Arthritis Paternal Grandfather     Social History   Socioeconomic History  . Marital status: Single    Spouse name: Not on file  . Number of children: Not on file  . Years of education: Not on file  . Highest education level: Not on file  Social Needs  . Financial resource strain: Not on file  . Food insecurity - worry: Not on file  . Food insecurity - inability: Not on file  . Transportation needs - medical: Not on file  . Transportation needs - non-medical: Not on file  Occupational History  . Not on file  Tobacco Use  . Smoking status: Never Smoker  . Smokeless tobacco: Never Used  Substance and Sexual Activity  . Alcohol use: No    Alcohol/week: 0.0 oz  . Drug use: No  . Sexual activity: Yes  Other Topics Concern  . Not on file  Social History Narrative  . Not on file     Constitutional: Pt reports weight loss (recent bariatric surgery). Denies fever, malaise, fatigue, headache.  HEENT: Denies eye pain, eye redness, ear pain, ringing in the ears, wax buildup,  runny nose, nasal congestion, bloody nose, or sore throat. Respiratory: Denies difficulty breathing, shortness of breath, cough or sputum production.   Cardiovascular: Denies chest pain, chest tightness, palpitations or swelling in the hands or feet.  Gastrointestinal: Denies abdominal pain, bloating, constipation, diarrhea or blood in the stool.  GU: Denies urgency, frequency, pain with urination, burning sensation, blood in urine, odor or discharge. Musculoskeletal: Denies decrease in range of motion, difficulty with gait, muscle pain or joint pain and swelling.  Skin: Denies redness, rashes, lesions or ulcercations.  Neurological: Denies dizziness, difficulty with memory, difficulty with speech or problems with balance and coordination.  Psych: Denies anxiety, depression, SI/HI.  No other specific complaints in a complete review of systems (except as listed in HPI above).  Objective:   Physical Exam   BP 120/80   Pulse 81   Temp 98.3 F (36.8 C) (Oral)   Ht 5\' 4"  (1.626 m)   Wt 281 lb (127.5 kg)   LMP 07/01/2017   SpO2 98%   BMI 48.23 kg/m  Wt Readings from Last 3 Encounters:  07/18/17 281 lb (127.5 kg)  06/20/17 290 lb (131.5 kg)  02/25/17 286 lb (129.7 kg)    General: Appears her stated age, obese in NAD. Skin: Warm, dry  and intact. Hair noted on chin. HEENT: Head: normal shape and size; Eyes: sclera white, no icterus, conjunctiva pink, PERRLA and EOMs intact; Ears: Tm's gray and intact, normal light reflex; Throat/Mouth: Teeth present, mucosa pink and moist, no exudate, lesions or ulcerations noted.  Neck:  Neck supple, trachea midline. No masses, lumps present.  Cardiovascular: Normal rate and rhythm. S1,S2 noted.  No murmur, rubs or gallops noted. No JVD or BLE edema.  Pulmonary/Chest: Normal effort and positive vesicular breath sounds. No respiratory distress. No wheezes, rales or ronchi noted.  Abdomen: Soft and nontender. Normal bowel sounds. No distention or masses  noted. Liver, spleen and kidneys non palpable. Pelvic: Normal female anatomy. Unable to visualize cervix. Thin white discharge noted, no odor. Adnexa non palpable. Musculoskeletal: Strength 5/5 BUE/BLE. No difficulty with gait.  Neurological: Alert and oriented. Cranial nerves II-XII grossly intact. Coordination normal.  Psychiatric: Mood and affect normal. Behavior is normal. Judgment and thought content normal.    BMET    Component Value Date/Time   NA 136 08/28/2015 1440   K 3.9 08/28/2015 1440   CL 105 08/28/2015 1440   CO2 27 08/28/2015 1440   GLUCOSE 86 08/28/2015 1440   BUN 11 08/28/2015 1440   CREATININE 0.79 08/28/2015 1440   CALCIUM 9.0 08/28/2015 1440    Lipid Panel     Component Value Date/Time   CHOL 137 08/28/2015 1440   TRIG 40.0 08/28/2015 1440   HDL 42.40 08/28/2015 1440   CHOLHDL 3 08/28/2015 1440   VLDL 8.0 08/28/2015 1440   LDLCALC 87 08/28/2015 1440    CBC    Component Value Date/Time   WBC 6.2 08/28/2015 1440   RBC 5.06 08/28/2015 1440   HGB 12.7 08/28/2015 1440   HCT 40.4 08/28/2015 1440   PLT 443.0 (H) 08/28/2015 1440   MCV 79.7 08/28/2015 1440   MCHC 31.4 08/28/2015 1440   RDW 14.7 08/28/2015 1440    Hgb A1C Lab Results  Component Value Date   HGBA1C 6.1 08/28/2015           Assessment & Plan:   Preventative Health Maintenance:  She declines flu shot today Tetanus UTD Pap smear today Encouraged her to consume a balanced diet and exercise regimen Advised her to see a dentist annually Will check CBC, CMET, Lipid and A1C today  RTC in 1 year, sooner if needed Nicki ReaperBAITY, REGINA, NP

## 2017-07-19 LAB — COMPREHENSIVE METABOLIC PANEL
ALT: 16 U/L (ref 0–35)
AST: 21 U/L (ref 0–37)
Albumin: 3.6 g/dL (ref 3.5–5.2)
Alkaline Phosphatase: 56 U/L (ref 39–117)
BILIRUBIN TOTAL: 0.4 mg/dL (ref 0.2–1.2)
BUN: 21 mg/dL (ref 6–23)
CALCIUM: 9.1 mg/dL (ref 8.4–10.5)
CO2: 24 meq/L (ref 19–32)
CREATININE: 0.83 mg/dL (ref 0.40–1.20)
Chloride: 103 mEq/L (ref 96–112)
GFR: 99.36 mL/min (ref >60.00)
GLUCOSE: 83 mg/dL (ref 70–99)
Potassium: 4.3 mEq/L (ref 3.5–5.1)
Sodium: 133 mEq/L — ABNORMAL LOW (ref 135–145)
Total Protein: 7.7 g/dL (ref 6.0–8.3)

## 2017-07-19 LAB — LIPID PANEL
CHOL/HDL RATIO: 3
Cholesterol: 148 mg/dL (ref 0–200)
HDL: 49.7 mg/dL (ref >39.00)
LDL Cholesterol: 89 mg/dL (ref 0–99)
NONHDL: 97.87
Triglycerides: 44 mg/dL (ref 0.0–149.0)
VLDL: 8.8 mg/dL (ref 0.0–40.0)

## 2017-07-19 LAB — CBC
HCT: 45.2 % (ref 36.0–46.0)
Hemoglobin: 14.6 g/dL (ref 12.0–15.0)
MCHC: 32.2 g/dL (ref 30.0–36.0)
MCV: 86.2 fl (ref 78.0–100.0)
Platelets: 328 10*3/uL (ref 150.0–400.0)
RBC: 5.24 Mil/uL — AB (ref 3.87–5.11)
RDW: 13.4 % (ref 11.5–15.5)
WBC: 6.1 10*3/uL (ref 4.0–10.5)

## 2017-07-19 LAB — HEMOGLOBIN A1C: Hgb A1c MFr Bld: 5.5 % (ref 4.6–6.5)

## 2017-07-20 LAB — CYTOLOGY - PAP
ADEQUACY: ABSENT
DIAGNOSIS: NEGATIVE
HPV (WINDOPATH): NOT DETECTED

## 2017-10-10 ENCOUNTER — Encounter: Payer: Self-pay | Admitting: Internal Medicine

## 2017-10-10 ENCOUNTER — Ambulatory Visit: Payer: Self-pay | Admitting: Internal Medicine

## 2017-10-10 ENCOUNTER — Ambulatory Visit: Payer: BC Managed Care – PPO | Admitting: Internal Medicine

## 2017-10-10 VITALS — BP 112/68 | HR 65 | Temp 98.2°F | Resp 18 | Wt 288.0 lb

## 2017-10-10 DIAGNOSIS — J0100 Acute maxillary sinusitis, unspecified: Secondary | ICD-10-CM | POA: Diagnosis not present

## 2017-10-10 MED ORDER — FLUTICASONE PROPIONATE 50 MCG/ACT NA SUSP: 2.0000 | Freq: Every day | NASAL | 12 refills | Status: DC

## 2017-10-10 NOTE — Progress Notes (Signed)
   Subjective:    Patient ID: Shelia Diaz, female    DOB: 15-Oct-1980, 37 y.o.   MRN: [id number]  HPI Here due to respiratory symptoms  Having sinus symptoms--for about a week Rhinorrhea, sneezing, maxillary pressure Some cough Zyrtec not helping---thought it could be allergies Now with some sputum--thick Some blood tinging from nose Has to sleep sitting up due to the severe drainage No fever No SOB Slight sore throat--not bothering her swallowing No ear pain  Tried mucinex also--only helped a little  Current Outpatient Medications on File Prior to Visit  Medication Sig Dispense Refill  . Calcium 500 MG CHEW Chew 1 each 3 (three) times daily by mouth.    . cetirizine (ZYRTEC) 10 MG tablet Take 10 mg daily as needed by mouth.    . Iron Carbonyl-Vitamin C-FOS (CHEWABLE IRON) 30-10-25 MG CHEW Chew by mouth.    . Multiple Vitamin (MULTIVITAMIN) capsule Take by mouth.    . Phentermine HCl 8 MG TABS 1 tablet.    . sulfamethoxazole-trimethoprim (BACTRIM DS,SEPTRA DS) 800-160 MG tablet Take by mouth.    . topiramate (TOPAMAX) 25 MG tablet 1 tablet.    . vitamin B-12 (CYANOCOBALAMIN) 1000 MCG tablet Take by mouth.     No current facility-administered medications on file prior to visit.     Allergies  Allergen Reactions  . Penicillins Hives    Past Medical History:  Diagnosis Date  . Allergy-induced asthma   . Chicken pox   . History of anemia     Past Surgical History:  Procedure Laterality Date  . LAPAROSCOPIC GASTRIC SLEEVE RESECTION  03/16/2016    Family History  Problem Relation Age of Onset  . Heart disease Mother   . Stroke Mother   . Hypertension Mother   . Heart disease Father   . Stroke Father   . Arthritis Paternal Grandfather     Social History   Socioeconomic History  . Marital status: Single    Spouse name: Not on file  . Number of children: Not on file  . Years of education: Not on file  . Highest education level: Not on file  Social Needs   . Financial resource strain: Not on file  . Food insecurity - worry: Not on file  . Food insecurity - inability: Not on file  . Transportation needs - medical: Not on file  . Transportation needs - non-medical: Not on file  Occupational History  . Not on file  Tobacco Use  . Smoking status: Never Smoker  . Smokeless tobacco: Never Used  Substance and Sexual Activity  . Alcohol use: No    Alcohol/week: 0.0 oz  . Drug use: No  . Sexual activity: Yes  Other Topics Concern  . Not on file  Social History Narrative  . 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 No oropharyngeal exudate.  Mild maxillary tenderness TMs normal Moderate nasal congestion Slight pharyngeal injection without tonsillar enlargement or exudate  Neck: No thyromegaly present.  Pulmonary/Chest: Effort normal and breath sounds normal. No respiratory distress. She has no wheezes. She has no rales.  Lymphadenopathy:    She has no cervical adenopathy.          Assessment & Plan:

## 2017-10-10 NOTE — Patient Instructions (Signed)
Please let me know if you are getting worse as the week goes on.

## 2017-10-10 NOTE — Assessment & Plan Note (Addendum)
Likely still viral Takes bactrim daily to prevent infected cysts--this may prevent bacterial infection Discussed trying flonase Supportive care If worsens later in week, would try z-pak and hold the bactrim

## 2018-01-12 ENCOUNTER — Ambulatory Visit: Payer: BC Managed Care – PPO | Admitting: Internal Medicine

## 2018-01-12 ENCOUNTER — Encounter: Payer: Self-pay | Admitting: Internal Medicine

## 2018-01-12 VITALS — BP 120/80 | HR 61 | Temp 98.1°F | Wt 288.0 lb

## 2018-01-12 DIAGNOSIS — M7521 Bicipital tendinitis, right shoulder: Secondary | ICD-10-CM | POA: Diagnosis not present

## 2018-01-12 DIAGNOSIS — M25511 Pain in right shoulder: Secondary | ICD-10-CM

## 2018-01-12 MED ORDER — NAPROXEN 500 MG PO TABS: 500.0000 mg | ORAL_TABLET | Freq: Two times a day (BID) | ORAL | 0 refills | Status: DC

## 2018-01-12 NOTE — Patient Instructions (Signed)
Biceps Tendon Tendinitis (Proximal) and Tenosynovitis The proximal biceps tendon is a strong cord of tissue that connects the biceps muscle, on the front of the upper arm, to the shoulder blade. Tendinitis is inflammation of a tendon. Tenosynovitis is inflammation of the lining around the tendon (tendon sheath). These conditions often occur at the same time, and they can interfere with the ability to bend the elbow and turn the hand palm-up (supination). Proximal biceps tendon tendinitis and tenosynovitis are usually caused by overusing the shoulder joint and the biceps muscle. These conditions usually heal within 6 weeks. Proximal biceps tendon tendinitis may include a grade 1 or grade 2 strain of the tendon. A grade 1 strain is mild, and it involves a slight pull of the tendon without any stretching or noticeable tearing of the tendon. There is usually no loss of biceps muscle strength. A grade 2 strain is moderate, and it involves a small tear in the tendon. The tendon is stretched, and biceps strength is usually decreased. What are the causes? This condition may be caused by:  A sudden increase in frequency or intensity of activity that involves the shoulder and the biceps muscle.  Overuse of the biceps muscle. This can happen when you do the same movements over and over, such as: ? Supination. ? Forceful straightening (hyperextension) of the elbow. ? Bending the elbow.  A direct, forceful hit or injury (trauma) to the elbow. This is rare.  What increases the risk? The following factors may make you more likely to develop this condition:  Playing contact sports.  Playing sports that involve throwing and overhead movements, including racket sports, gymnastics, weight lifting, or bodybuilding.  Doing physical labor.  Having poor strength and flexibility of the arm and shoulder.  What are the signs or symptoms? Symptoms of this condition may include:  Pain and inflammation in the front  of the shoulder. Pain may get worse with movement, especially when you use resistance, as in weight lifting.  A feeling of warmth in the front of the shoulder.  Limited range of motion of the shoulder and the elbow.  A crackling sound (crepitation) when you move or touch the shoulder or the upper arm.  In some cases, symptoms may return (recur) after treatment, and they may be long-lasting (chronic). How is this diagnosed? This condition is diagnosed based on your symptoms, your medical history, and a physical exam. You may have tests, including X-rays or MRIs. Your health care provider may test your range of motion by asking you to do arm movements. How is this treated? This condition is treated by resting and icing the injured area, and by doing physical therapy exercises. Depending on the severity of your condition, treatment may also include:  Medicines to help relieve pain and inflammation.  Ultrasound therapy. This is the application of sound waves to the injured area.  Injecting medicines (corticosteroids) into your tendon sheath.  Injecting medicines that numb the area (local anesthetics).  Surgery to remove the damaged part of the tendon and reattach the undamaged part of the tendon to the arm bone (humerus). This is usually only done if you have symptoms that do not get better with other treatment methods.  Follow these instructions at home: Managing pain, stiffness, and swelling  If directed, put ice on the injured area: ? Put ice in a plastic bag. ? Place a towel between your skin and the bag. ? Leave the ice on for 20 minutes, 2-3 times a day.    Move your fingers often to avoid stiffness and to lessen swelling.  Raise (elevate) the injured area above the level of your heart while you are sitting or lying down.  If directed, apply heat to the affected area before you exercise. Use the heat source that your health care provider recommends, such as a moist heat pack or a  heating pad. ? Place a towel between your skin and the heat source. ? Leave the heat on for 20-30 minutes. ? Remove the heat if your skin turns bright red. This is especially important if you are unable to feel pain, heat, or cold. You may have a greater risk of getting burned. Activity  Return to your normal activities as told by your health care provider. Ask your health care provider what activities are safe for you.  Do not lift anything that is heavier than 10 lb (4.5 kg) until your health care provider tells you that it is safe.  Avoid activities that cause pain or make your condition worse.  Do exercises as told by your health care provider. General instructions  Take over-the-counter and prescription medicines only as told by your health care provider.  Do not drive or operate heavy machinery while taking prescription pain medicines.  Keep all follow-up visits as told by your health care provider. This is important. How is this prevented?  Warm up and stretch before being active.  Cool down and stretch after being active.  Give your body time to rest between periods of activity.  Make sure any equipment that you use is fitted to you.  Be safe and responsible while being active to avoid falls.  Do at least 150 minutes of moderate-intensity aerobic exercise each week, such as brisk walking or water aerobics.  Maintain physical fitness, including: ? Strength. ? Flexibility. ? Cardiovascular fitness. ? Endurance. Contact a health care provider if:  You have symptoms that get worse or do not get better after 2 weeks of treatment.  You develop new symptoms. Get help right away if:  You develop severe pain. This information is not intended to replace advice given to you by your health care provider. Make sure you discuss any questions you have with your health care provider. Document Released: 08/23/2005 Document Revised: 04/29/2016 Document Reviewed:  08/01/2015 Elsevier Interactive Patient Education  2018 [employer]  

## 2018-01-12 NOTE — Progress Notes (Signed)
Subjective:    Patient ID: Shelia Diaz, female    DOB: 1980-12-21, 37 y.o.   MRN: [id number]  HPI  Pt presents to the clinic today with c/o right shoulder pain. She reports this started 3 weeks ago. She describes the pain is sharp and pulling. She reports her right arm feels heavy, but she denies numbness, tingling or weakness. She denies any injury to the area, but recently started working out. She has tried ice but has not tried anything OTC for her symptoms.   Review of Systems      Past Medical History:  Diagnosis Date  . Allergy-induced asthma   . Chicken pox   . History of anemia     Current Outpatient Medications  Medication Sig Dispense Refill  . Calcium 500 MG CHEW Chew 1 each 3 (three) times daily by mouth.    . cetirizine (ZYRTEC) 10 MG tablet Take 10 mg daily as needed by mouth.    . fluticasone (FLONASE) 50 MCG/ACT nasal spray Place 2 sprays into both nostrils daily. In each nostril 16 g 12  . Iron Carbonyl-Vitamin C-FOS (CHEWABLE IRON) 30-10-25 MG CHEW Chew by mouth.    . Multiple Vitamin (MULTIVITAMIN) capsule Take by mouth.    . Phentermine HCl 8 MG TABS 1 tablet.    . topiramate (TOPAMAX) 25 MG tablet 1 tablet.    . vitamin B-12 (CYANOCOBALAMIN) 1000 MCG tablet Take by mouth.     No current facility-administered medications for this visit.     Allergies  Allergen Reactions  . Penicillins Hives    Family History  Problem Relation Age of Onset  . Heart disease Mother   . Stroke Mother   . Hypertension Mother   . Heart disease Father   . Stroke Father   . Arthritis Paternal Grandfather     Social History   Socioeconomic History  . Marital status: Single    Spouse name: Not on file  . Number of children: Not on file  . Years of education: Not on file  . Highest education level: Not on file  Occupational History  . Not on file  Social Needs  . Financial resource strain: Not on file  . Food insecurity:    Worry: Not on file   Inability: Not on file  . Transportation needs:    Medical: Not on file    Non-medical: Not on file  Tobacco Use  . Smoking status: Never Smoker  . Smokeless tobacco: Never Used  Substance and Sexual Activity  . Alcohol use: No    Alcohol/week: 0.0 oz  . Drug use: No  . Sexual activity: Yes  Lifestyle  . Physical activity:    Days per week: Not on file    Minutes per session: Not on file  . Stress: Not on file  Relationships  . Social connections:    Talks on phone: Not on file    Gets together: Not on file    Attends religious service: Not on file    Active member of club or organization: Not on file    Attends meetings of clubs or organizations: Not on file    Relationship status: Not on file  . Intimate partner violence:    Fear of current or ex partner: Not on file    Emotionally abused: Not on file    Physically abused: Not on file    Forced sexual activity: Not on file  Other Topics Concern  . Not on file  Social History Narrative  . Not on file     Constitutional: Denies fever, malaise, fatigue, headache or abrupt weight changes.  Musculoskeletal: Pt reports right shoulder pain. Denies decrease in range of motion, difficulty with gait, muscle pain or joint swelling.  Skin: Denies redness, rashes, lesions or ulcercations.    No other specific complaints in a complete review of systems (except as listed in HPI above).  Objective:   Physical Exam   BP 120/80   Pulse 61   Temp 98.1 F (36.7 C) (Oral)   Wt 288 lb (130.6 kg)   SpO2 99%   BMI 49.44 kg/m  Wt Readings from Last 3 Encounters:  01/12/18 288 lb (130.6 kg)  10/10/17 288 lb (130.6 kg)  07/18/17 281 lb (127.5 kg)    General: Appears her stated age, obese in NAD. Musculoskeletal: Normal internal, external range of motion of the right shoulder. Normal abduction and adduction. Pain with palpation over the right anterior proximal bicceps tendon. Neurological: Alert and oriented. Sensation intact to  BUE.  BMET    Component Value Date/Time   NA 133 (L) 07/18/2017 1621   K 4.3 07/18/2017 1621   CL 103 07/18/2017 1621   CO2 24 07/18/2017 1621   GLUCOSE 83 07/18/2017 1621   BUN 21 07/18/2017 1621   CREATININE 0.83 07/18/2017 1621   CALCIUM 9.1 07/18/2017 1621    Lipid Panel     Component Value Date/Time   CHOL 148 07/18/2017 1621   TRIG 44.0 07/18/2017 1621   HDL 49.70 07/18/2017 1621   CHOLHDL 3 07/18/2017 1621   VLDL 8.8 07/18/2017 1621   LDLCALC 89 07/18/2017 1621    CBC    Component Value Date/Time   WBC 6.1 07/18/2017 1621   RBC 5.24 (H) 07/18/2017 1621   HGB 14.6 07/18/2017 1621   HCT 45.2 07/18/2017 1621   PLT 328.0 07/18/2017 1621   MCV 86.2 07/18/2017 1621   MCHC 32.2 07/18/2017 1621   RDW 13.4 07/18/2017 1621    Hgb A1C Lab Results  Component Value Date   HGBA1C 5.5 07/18/2017           Assessment & Plan:    Right Shoulder Pain:  Likely biceps tendonitis Discusses stretching exercises and avoid overuse eRx for Naproxen 500 mg BID prn with meals  Consider PT vs xray if no improvement  Return precautions discussed Nicki Reaper, NP

## 2018-01-13 ENCOUNTER — Encounter: Payer: Self-pay | Admitting: Internal Medicine

## 2018-03-11 ENCOUNTER — Other Ambulatory Visit: Payer: Self-pay | Admitting: Internal Medicine

## 2018-04-07 ENCOUNTER — Encounter: Payer: Self-pay | Admitting: Family Medicine

## 2018-04-07 ENCOUNTER — Ambulatory Visit: Payer: BC Managed Care – PPO | Admitting: Family Medicine

## 2018-04-07 VITALS — BP 120/74 | HR 69 | Temp 98.2°F | Resp 18 | Ht 64.0 in | Wt 293.4 lb

## 2018-04-07 DIAGNOSIS — J040 Acute laryngitis: Secondary | ICD-10-CM | POA: Diagnosis not present

## 2018-04-07 DIAGNOSIS — R0982 Postnasal drip: Secondary | ICD-10-CM | POA: Diagnosis not present

## 2018-04-07 DIAGNOSIS — J302 Other seasonal allergic rhinitis: Secondary | ICD-10-CM | POA: Diagnosis not present

## 2018-04-07 MED ORDER — PREDNISONE 5 MG PO TABS: 5.0000 mg | ORAL_TABLET | Freq: Every day | ORAL | 0 refills | Status: DC

## 2018-04-07 NOTE — Patient Instructions (Signed)
Take zyrtec every day Use Flonase 1 spray each nostril every day  Drink warm fluids to soothe scratchy voice, increase water intake  Do good handwashing

## 2018-04-07 NOTE — Progress Notes (Signed)
   Subjective:    Patient ID: Shelia Diaz, female    DOB: 21-Sep-1980, 37 y.o.   MRN: [id number]  HPI   Patient presents to clinic c/o congestion, nasal drainage, scratchy voice for past 5 days. She has known seasonal allergies, but states she has not been good about taking daily zyrtec. Denies fever or chills. Denies cough or SOB. 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   Social History   Tobacco Use  . Smoking status: Never Smoker  . Smokeless tobacco: Never Used  Substance Use Topics  . Alcohol use: No    Alcohol/week: 0.0 oz    Patient Active Problem List   Diagnosis Date Noted  . Acute non-recurrent maxillary sinusitis 10/10/2017  . Obesity, morbid, BMI 50 or higher (HCC) 07/29/2015  . Hidradenitis suppurativa 07/29/2015  . Allergy-induced asthma 07/29/2015   Review of Systems  Constitutional: Negative for chills and fever.  HENT: Positive for congestion, postnasal drip, rhinorrhea, sinus pressure and voice change.        Scratchy voice.   Eyes: Negative for pain, discharge, redness and itching.  Respiratory: Negative for cough, chest tightness, shortness of breath and wheezing.   Cardiovascular: Negative for chest pain, palpitations and leg swelling.  Gastrointestinal: Negative.   Genitourinary: Negative.   Musculoskeletal: Negative.   Skin: Negative for color change and pallor.  Allergic/Immunologic: Positive for environmental allergies.  Neurological: Negative for dizziness, weakness, light-headedness and headaches.       Objective:   Physical Exam  Constitutional: She is oriented to person, place, and time. She appears well-developed and well-nourished. No distress.  HENT:  Head: Normocephalic and atraumatic.  Right Ear: Hearing, tympanic membrane and external ear normal.  Left Ear: Hearing, tympanic membrane and external ear normal.  Nose: Rhinorrhea present.  Clear nasal drainage. +cobblestoning pattern in throat consistent with post nasal drip. Tonsils  normal in size, no exudates. Voice is raspy.   Eyes: Pupils are equal, round, and reactive to light. EOM are normal. Right eye exhibits no discharge. Left eye exhibits no discharge. No scleral icterus.  Neck: Normal range of motion. Neck supple. No tracheal deviation present.  Cardiovascular: Normal rate, regular rhythm and normal heart sounds.  Pulmonary/Chest: Effort normal and breath sounds normal. No respiratory distress. She has no wheezes. She has no rales.  Neurological: She is alert and oriented to person, place, and time. No cranial nerve deficit. Coordination normal.  Gait normal  Skin: Skin is warm and dry. No pallor.  Psychiatric: She has a normal mood and affect. Her behavior is normal.  Nursing note and vitals reviewed.  Vitals:   04/07/18 1455  BP: 120/74  Pulse: 69  Resp: 18  Temp: 98.2 F (36.8 C)  SpO2: 99%       Assessment & Plan:  Acute laryngitis -- Prednisone 5mg  daily for 5 days. Increase fluid intake. Can drink warm fluids to soothe throat and do salt water gargles.  Post nasal drip -- take zyrtec and flonase consistently to help this. Increase fluids.  Allergic Rhinitis -- take zyrtec and flonase consistently.  Follow up as needed, please do not hesitate to call office if symptoms do not improve or worsen.

## 2018-06-13 ENCOUNTER — Encounter: Payer: Self-pay | Admitting: Internal Medicine

## 2018-06-20 ENCOUNTER — Ambulatory Visit: Payer: BC Managed Care – PPO | Admitting: Internal Medicine

## 2018-06-21 ENCOUNTER — Encounter: Payer: Self-pay | Admitting: Internal Medicine

## 2018-06-21 ENCOUNTER — Ambulatory Visit: Payer: BC Managed Care – PPO | Admitting: Internal Medicine

## 2018-06-21 DIAGNOSIS — L732 Hidradenitis suppurativa: Secondary | ICD-10-CM

## 2018-06-21 MED ORDER — DOXYCYCLINE HYCLATE 100 MG PO TABS: 100.0000 mg | ORAL_TABLET | Freq: Two times a day (BID) | ORAL | 0 refills | Status: DC

## 2018-06-21 NOTE — Patient Instructions (Signed)
Skin Abscess A skin abscess is an infected area on or under your skin that contains pus and other material. An abscess can happen almost anywhere on your body. Some abscesses break open (rupture) on their own. Most continue to get worse unless they are treated. The infection can spread deeper into the body and into your blood, which can make you feel sick. Treatment usually involves draining the abscess. Follow these instructions at home: Abscess Care  If you have an abscess that has not drained, place a warm, clean, wet washcloth over the abscess several times a day. Do this as told by your doctor.  Follow instructions from your doctor about how to take care of your abscess. Make sure you: ? Cover the abscess with a bandage (dressing). ? Change your bandage or gauze as told by your doctor. ? Wash your hands with soap and water before you change the bandage or gauze. If you cannot use soap and water, use hand sanitizer.  Check your abscess every day for signs that the infection is getting worse. Check for: ? More redness, swelling, or pain. ? More fluid or blood. ? Warmth. ? More pus or a bad smell. Medicines   Take over-the-counter and prescription medicines only as told by your doctor.  If you were prescribed an antibiotic medicine, take it as told by your doctor. Do not stop taking the antibiotic even if you start to feel better. General instructions  To avoid spreading the infection: ? Do not share personal care items, towels, or hot tubs with others. ? Avoid making skin-to-skin contact with other people.  Keep all follow-up visits as told by your doctor. This is important. Contact a doctor if:  You have more redness, swelling, or pain around your abscess.  You have more fluid or blood coming from your abscess.  Your abscess feels warm when you touch it.  You have more pus or a bad smell coming from your abscess.  You have a fever.  Your muscles ache.  You have  chills.  You feel sick. Get help right away if:  You have very bad (severe) pain.  You see red streaks on your skin spreading away from the abscess. This information is not intended to replace advice given to you by your health care provider. Make sure you discuss any questions you have with your health care provider. Document Released: 02/09/2008 Document Revised: 04/18/2016 Document Reviewed: 07/02/2015 Elsevier Interactive Patient Education  2018 [employer]  

## 2018-06-21 NOTE — Assessment & Plan Note (Signed)
With current flare Continue warm compresses TID eRx for Doxycycline 100 mg BID x 10 days Monitor for fever, chills or body aches

## 2018-06-21 NOTE — Progress Notes (Signed)
Subjective:    Patient ID: Shelia Diaz, female    DOB: March 16, 1981, 37 y.o.   MRN: [id number]  HPI  Pt presents to the clinic today with c/o multiple small cysts under her breast. She reports this started about 2 weeks ago. The areas are red, warm and tender. She has noticed some drainage from the cyst. She denies fever, chills or body aches. She has tried warm compresses with minimal relief. She has a history of Hidradenitis Suppurative.  Review of Systems   Past Medical History:  Diagnosis Date  . Allergy-induced asthma   . Chicken pox   . History of anemia     Current Outpatient Medications  Medication Sig Dispense Refill  . Calcium 500 MG CHEW Chew 1 each 3 (three) times daily by mouth.    . cetirizine (ZYRTEC) 10 MG tablet Take 10 mg daily as needed by mouth.    . fluticasone (FLONASE) 50 MCG/ACT nasal spray Place 2 sprays into both nostrils daily. In each nostril 16 g 12  . Iron Carbonyl-Vitamin C-FOS (CHEWABLE IRON) 30-10-25 MG CHEW Chew by mouth.    . Multiple Vitamin (MULTIVITAMIN) capsule Take by mouth.    . naproxen (NAPROSYN) 500 MG tablet Take 1 tablet (500 mg total) by mouth 2 (two) times daily with a meal. 60 tablet 0  . Phentermine HCl 8 MG TABS 1 tablet.    . predniSONE (DELTASONE) 5 MG tablet Take 1 tablet (5 mg total) by mouth daily with breakfast. 5 tablet 0  . topiramate (TOPAMAX) 25 MG tablet 1 tablet.    . vitamin B-12 (CYANOCOBALAMIN) 1000 MCG tablet Take by mouth.     No current facility-administered medications for this visit.     Allergies  Allergen Reactions  . Penicillins Hives    Family History  Problem Relation Age of Onset  . Heart disease Mother   . Stroke Mother   . Hypertension Mother   . Heart disease Father   . Stroke Father   . Arthritis Paternal Grandfather     Social History   Socioeconomic History  . Marital status: Single    Spouse name: Not on file  . Number of children: Not on file  . Years of education: Not on  file  . Highest education level: Not on file  Occupational History  . Not on file  Social Needs  . Financial resource strain: Not on file  . Food insecurity:    Worry: Not on file    Inability: Not on file  . Transportation needs:    Medical: Not on file    Non-medical: Not on file  Tobacco Use  . Smoking status: Never Smoker  . Smokeless tobacco: Never Used  Substance and Sexual Activity  . Alcohol use: No    Alcohol/week: 0.0 standard drinks  . Drug use: No  . Sexual activity: Yes  Lifestyle  . Physical activity:    Days per week: Not on file    Minutes per session: Not on file  . Stress: Not on file  Relationships  . Social connections:    Talks on phone: Not on file    Gets together: Not on file    Attends religious service: Not on file    Active member of club or organization: Not on file    Attends meetings of clubs or organizations: Not on file    Relationship status: Not on file  . Intimate partner violence:    Fear of current or  ex partner: Not on file    Emotionally abused: Not on file    Physically abused: Not on file    Forced sexual activity: Not on file  Other Topics Concern  . Not on file  Social History Narrative  . Not on file     Constitutional: Denies fever, malaise, fatigue, headache or abrupt weight changes.  Skin: Pt reports multiple cysts under left breast. Denies rashes, ulcercations.    No other specific complaints in a complete review of systems (except as listed in HPI above).   Objective:   Physical Exam  BP 122/84   Pulse 67   Temp 98 F (36.7 C) (Oral)   Wt 279 lb (126.6 kg)   SpO2 97%   BMI 47.89 kg/m  Wt Readings from Last 3 Encounters:  06/21/18 279 lb (126.6 kg)  04/07/18 293 lb 6 oz (133.1 kg)  01/12/18 288 lb (130.6 kg)    General: Appears her stated age, obese, in NAD. Skin: Multiple small < 1 cm cysts, noted under left breast.  BMET    Component Value Date/Time   NA 133 (L) 07/18/2017 1621   K 4.3  07/18/2017 1621   CL 103 07/18/2017 1621   CO2 24 07/18/2017 1621   GLUCOSE 83 07/18/2017 1621   BUN 21 07/18/2017 1621   CREATININE 0.83 07/18/2017 1621   CALCIUM 9.1 07/18/2017 1621    Lipid Panel     Component Value Date/Time   CHOL 148 07/18/2017 1621   TRIG 44.0 07/18/2017 1621   HDL 49.70 07/18/2017 1621   CHOLHDL 3 07/18/2017 1621   VLDL 8.8 07/18/2017 1621   LDLCALC 89 07/18/2017 1621    CBC    Component Value Date/Time   WBC 6.1 07/18/2017 1621   RBC 5.24 (H) 07/18/2017 1621   HGB 14.6 07/18/2017 1621   HCT 45.2 07/18/2017 1621   PLT 328.0 07/18/2017 1621   MCV 86.2 07/18/2017 1621   MCHC 32.2 07/18/2017 1621   RDW 13.4 07/18/2017 1621    Hgb A1C Lab Results  Component Value Date   HGBA1C 5.5 07/18/2017            Assessment & Plan:

## 2018-08-28 ENCOUNTER — Ambulatory Visit: Payer: BC Managed Care – PPO | Admitting: Internal Medicine

## 2018-08-28 ENCOUNTER — Encounter: Payer: Self-pay | Admitting: Internal Medicine

## 2018-08-28 VITALS — BP 122/82 | HR 69 | Temp 98.1°F | Wt 280.0 lb

## 2018-08-28 DIAGNOSIS — S39012A Strain of muscle, fascia and tendon of lower back, initial encounter: Secondary | ICD-10-CM

## 2018-08-28 MED ORDER — METHOCARBAMOL 500 MG PO TABS: 500.0000 mg | ORAL_TABLET | Freq: Two times a day (BID) | ORAL | 0 refills | Status: DC | PRN

## 2018-08-28 MED ORDER — IBUPROFEN 600 MG PO TABS: 600.0000 mg | ORAL_TABLET | Freq: Three times a day (TID) | ORAL | 0 refills | Status: DC | PRN

## 2018-08-28 NOTE — Progress Notes (Signed)
Subjective:    Patient ID: Shelia Diaz, female    DOB: 1981/01/10, 37 y.o.   MRN: [id number]  HPI  Pt presents to the clinic today with c/o low back pain. She reports this started 1 week ago. She describes the pain as constant throbbing with intermittent shooting pains. The pain does not radiate. She denies numbness, tingling or weakness in the lower extremities. She denies any issues with bowel or bladder. She denies any injury to the area but has been lifting weights. She has tried Aleve and heating pad with minimal relief.   Review of Systems  Past Medical History:  Diagnosis Date  . Allergy-induced asthma   . Chicken pox   . History of anemia     Current Outpatient Medications  Medication Sig Dispense Refill  . Calcium 500 MG CHEW Chew 1 each 3 (three) times daily by mouth.    . cetirizine (ZYRTEC) 10 MG tablet Take 10 mg daily as needed by mouth.    . doxycycline (VIBRA-TABS) 100 MG tablet Take 1 tablet (100 mg total) by mouth 2 (two) times daily. 20 tablet 0  . fluticasone (FLONASE) 50 MCG/ACT nasal spray Place 2 sprays into both nostrils daily. In each nostril 16 g 12  . Iron Carbonyl-Vitamin C-FOS (CHEWABLE IRON) 30-10-25 MG CHEW Chew by mouth.    . Multiple Vitamin (MULTIVITAMIN) capsule Take by mouth.    . Phentermine HCl 8 MG TABS 1 tablet.    . predniSONE (DELTASONE) 5 MG tablet Take 1 tablet (5 mg total) by mouth daily with breakfast. 5 tablet 0  . topiramate (TOPAMAX) 25 MG tablet 1 tablet.    . vitamin B-12 (CYANOCOBALAMIN) 1000 MCG tablet Take by mouth.     No current facility-administered medications for this visit.     Allergies  Allergen Reactions  . Penicillins Hives    Family History  Problem Relation Age of Onset  . Heart disease Mother   . Stroke Mother   . Hypertension Mother   . Heart disease Father   . Stroke Father   . Arthritis Paternal Grandfather     Social History   Socioeconomic History  . Marital status: Single    Spouse  name: Not on file  . Number of children: Not on file  . Years of education: Not on file  . Highest education level: Not on file  Occupational History  . Not on file  Social Needs  . Financial resource strain: Not on file  . Food insecurity:    Worry: Not on file    Inability: Not on file  . Transportation needs:    Medical: Not on file    Non-medical: Not on file  Tobacco Use  . Smoking status: Never Smoker  . Smokeless tobacco: Never Used  Substance and Sexual Activity  . Alcohol use: No    Alcohol/week: 0.0 standard drinks  . Drug use: No  . Sexual activity: Yes  Lifestyle  . Physical activity:    Days per week: Not on file    Minutes per session: Not on file  . Stress: Not on file  Relationships  . Social connections:    Talks on phone: Not on file    Gets together: Not on file    Attends religious service: Not on file    Active member of club or organization: Not on file    Attends meetings of clubs or organizations: Not on file    Relationship status: Not on file  .  Intimate partner violence:    Fear of current or ex partner: Not on file    Emotionally abused: Not on file    Physically abused: Not on file    Forced sexual activity: Not on file  Other Topics Concern  . Not on file  Social History Narrative  . Not on file     Constitutional: Denies fever, malaise, fatigue, headache or abrupt weight changes.   Gastrointestinal: Denies abdominal pain, bloating, constipation, diarrhea or blood in the stool.  GU: Denies urgency, frequency, pain with urination, burning sensation, blood in urine, odor or discharge. Musculoskeletal: Pt reports low back pain. Denies decrease in range of motion, difficulty with gait, or joint swelling.  Neurological: Denies numbness, tingling, weakness, or problems with balance and coordination.    No other specific complaints in a complete review of systems (except as listed in HPI above).     Objective:   Physical Exam   BP  122/82   Pulse 69   Temp 98.1 F (36.7 C) (Oral)   Wt 280 lb (127 kg)   SpO2 98%   BMI 48.06 kg/m  Wt Readings from Last 3 Encounters:  08/28/18 280 lb (127 kg)  06/21/18 279 lb (126.6 kg)  04/07/18 293 lb 6 oz (133.1 kg)    General: Appears her stated age, obese, in NAD. Musculoskeletal: Decreased flexion of the lumbar spine. Normal flexion and rotation. Pain with palpation over the lumber spine and paralumbar muscles. Strength 5/5 BLE. No difficulty with gait.  Neurological: Alert and oriented. Negative SLR bilaterally.   BMET    Component Value Date/Time   NA 133 (L) 07/18/2017 1621   K 4.3 07/18/2017 1621   CL 103 07/18/2017 1621   CO2 24 07/18/2017 1621   GLUCOSE 83 07/18/2017 1621   BUN 21 07/18/2017 1621   CREATININE 0.83 07/18/2017 1621   CALCIUM 9.1 07/18/2017 1621    Lipid Panel     Component Value Date/Time   CHOL 148 07/18/2017 1621   TRIG 44.0 07/18/2017 1621   HDL 49.70 07/18/2017 1621   CHOLHDL 3 07/18/2017 1621   VLDL 8.8 07/18/2017 1621   LDLCALC 89 07/18/2017 1621    CBC    Component Value Date/Time   WBC 6.1 07/18/2017 1621   RBC 5.24 (H) 07/18/2017 1621   HGB 14.6 07/18/2017 1621   HCT 45.2 07/18/2017 1621   PLT 328.0 07/18/2017 1621   MCV 86.2 07/18/2017 1621   MCHC 32.2 07/18/2017 1621   RDW 13.4 07/18/2017 1621    Hgb A1C Lab Results  Component Value Date   HGBA1C 5.5 07/18/2017           Assessment & Plan:   Lumbar Strain:  RX for Ibuprofen 600 mg TID prn with food (avoid OTC NSAID's) RX for Methocarbamol 500 mg Q12H prn- sedation caution given Stretching exercises given Heat may continue to be helpful  Return precautions discussed Nicki Reaperegina Baity, NP

## 2018-08-28 NOTE — Patient Instructions (Signed)

## 2018-09-26 ENCOUNTER — Ambulatory Visit: Payer: BC Managed Care – PPO | Admitting: Internal Medicine

## 2018-09-26 ENCOUNTER — Encounter: Payer: Self-pay | Admitting: Internal Medicine

## 2018-09-26 ENCOUNTER — Ambulatory Visit (INDEPENDENT_AMBULATORY_CARE_PROVIDER_SITE_OTHER)
Admission: RE | Admit: 2018-09-26 | Discharge: 2018-09-26 | Disposition: A | Discharge status: Home / Self Care | Payer: BC Managed Care – PPO | Source: Ambulatory Visit | Attending: Internal Medicine | Admitting: Internal Medicine

## 2018-09-26 VITALS — BP 122/80 | HR 67 | Temp 98.1°F | Wt 283.0 lb

## 2018-09-26 DIAGNOSIS — M545 Low back pain, unspecified: Secondary | ICD-10-CM

## 2018-09-26 MED ORDER — METHOCARBAMOL 500 MG PO TABS: 500.0000 mg | ORAL_TABLET | Freq: Two times a day (BID) | ORAL | 0 refills | Status: DC | PRN

## 2018-09-26 MED ORDER — IBUPROFEN 600 MG PO TABS: 600.0000 mg | ORAL_TABLET | Freq: Three times a day (TID) | ORAL | 0 refills | Status: DC | PRN

## 2018-09-26 NOTE — Progress Notes (Signed)
Subjective:    Patient ID: Shelia Diaz, female    DOB: [birth year], 38 y.o.   MRN: [id number]  HPI  Pt presents to the clinic today with c/o low back pain. She reports this started 5 weeks ago. She describes the pain as throbbing with intermittent sharp, stabbing pains. The pain does not radiate. She denies numbness, tingling, weakness of lower extremities. She denies loss of bowel or bladder. She denies any injury to the area. She was seen 12/23 for the same. She was diagnosed with a lumbar strain. No xray was done. She was treated with Ibuprofen and Methocarbamol which she took without much relief.   Review of Systems      Past Medical History:  Diagnosis Date  . Allergy-induced asthma   . Chicken pox   . History of anemia     Current Outpatient Medications  Medication Sig Dispense Refill  . Calcium 500 MG CHEW Chew 1 each 3 (three) times daily by mouth.    . cetirizine (ZYRTEC) 10 MG tablet Take 10 mg daily as needed by mouth.    . clindamycin (CLINDAGEL) 1 % gel Apply topically.    . fluticasone (FLONASE) 50 MCG/ACT nasal spray Place 2 sprays into both nostrils daily. In each nostril 16 g 12  . ibuprofen (ADVIL,MOTRIN) 600 MG tablet Take 1 tablet (600 mg total) by mouth every 8 (eight) hours as needed. 30 tablet 0  . Iron Carbonyl-Vitamin C-FOS (CHEWABLE IRON) 30-10-25 MG CHEW Chew by mouth.    . methocarbamol (ROBAXIN) 500 MG tablet Take 1 tablet (500 mg total) by mouth every 12 (twelve) hours as needed for muscle spasms. 20 tablet 0  . Multiple Vitamin (MULTIVITAMIN) capsule Take by mouth.    . nystatin (MYCOSTATIN/NYSTOP) powder Apply to affected area daily to BID    . Phentermine HCl 8 MG TABS 1 tablet.    . predniSONE (DELTASONE) 5 MG tablet Take 1 tablet (5 mg total) by mouth daily with breakfast. 5 tablet 0  . topiramate (TOPAMAX) 25 MG tablet 1 tablet.    . vitamin B-12 (CYANOCOBALAMIN) 1000 MCG tablet Take by mouth.     No current facility-administered  medications for this visit.     Allergies  Allergen Reactions  . Penicillins Hives    Family History  Problem Relation Age of Onset  . Heart disease Mother   . Stroke Mother   . Hypertension Mother   . Heart disease Father   . Stroke Father   . Arthritis Paternal Grandfather     Social History   Socioeconomic History  . Marital status: Single    Spouse name: Not on file  . Number of children: Not on file  . Years of education: Not on file  . Highest education level: Not on file  Occupational History  . Not on file  Social Needs  . Financial resource strain: Not on file  . Food insecurity:    Worry: Not on file    Inability: Not on file  . Transportation needs:    Medical: Not on file    Non-medical: Not on file  Tobacco Use  . Smoking status: Never Smoker  . Smokeless tobacco: Never Used  Substance and Sexual Activity  . Alcohol use: No    Alcohol/week: 0.0 standard drinks  . Drug use: No  . Sexual activity: Yes  Lifestyle  . Physical activity:    Days per week: Not on file    Minutes per session: Not on  file  . Stress: Not on file  Relationships  . Social connections:    Talks on phone: Not on file    Gets together: Not on file    Attends religious service: Not on file    Active member of club or organization: Not on file    Attends meetings of clubs or organizations: Not on file    Relationship status: Not on file  . Intimate partner violence:    Fear of current or ex partner: Not on file    Emotionally abused: Not on file    Physically abused: Not on file    Forced sexual activity: Not on file  Other Topics Concern  . Not on file  Social History Narrative  . Not on file     Constitutional: Denies fever, malaise, fatigue, headache or abrupt weight changes.  Respiratory: Denies difficulty breathing, shortness of breath, cough or sputum production.   Cardiovascular: Denies chest pain, chest tightness, palpitations or swelling in the hands or  feet.  Gastrointestinal: Denies abdominal pain, bloating, constipation, diarrhea or blood in the stool.  GU: Denies urgency, frequency, pain with urination, burning sensation, blood in urine, odor or discharge. Musculoskeletal: Pt reports low back pain. Denies decrease in range of motion, difficulty with gait, or joint swelling.   No other specific complaints in a complete review of systems (except as listed in HPI above).  Objective:   Physical Exam  There were no vitals taken for this visit. Wt Readings from Last 3 Encounters:  08/28/18 280 lb (127 kg)  06/21/18 279 lb (126.6 kg)  04/07/18 293 lb 6 oz (133.1 kg)    General: Appears her stated age, obese, in NAD. Cardiovascular: Normal rate and rhythm. S1,S2 noted.  No murmur, rubs or gallops noted. Pulmonary/Chest: Normal effort and positive vesicular breath sounds. No respiratory distress. No wheezes, rales or ronchi noted.  Musculoskeletal: Decreased flexion due to pain.  Normal extension and rotation. Bony tenderness noted over the lumbar spine and bilateral paralumbar muscles. Strength 5/5 BLE. No difficulty with gait.  Neurological: Alert and oriented. Negative SLR bilaterally   BMET    Component Value Date/Time   NA 133 (L) 07/18/2017 1621   K 4.3 07/18/2017 1621   CL 103 07/18/2017 1621   CO2 24 07/18/2017 1621   GLUCOSE 83 07/18/2017 1621   BUN 21 07/18/2017 1621   CREATININE 0.83 07/18/2017 1621   CALCIUM 9.1 07/18/2017 1621    Lipid Panel     Component Value Date/Time   CHOL 148 07/18/2017 1621   TRIG 44.0 07/18/2017 1621   HDL 49.70 07/18/2017 1621   CHOLHDL 3 07/18/2017 1621   VLDL 8.8 07/18/2017 1621   LDLCALC 89 07/18/2017 1621    CBC    Component Value Date/Time   WBC 6.1 07/18/2017 1621   RBC 5.24 (H) 07/18/2017 1621   HGB 14.6 07/18/2017 1621   HCT 45.2 07/18/2017 1621   PLT 328.0 07/18/2017 1621   MCV 86.2 07/18/2017 1621   MCHC 32.2 07/18/2017 1621   RDW 13.4 07/18/2017 1621    Hgb  A1C Lab Results  Component Value Date   HGBA1C 5.5 07/18/2017          Assessment & Plan:   Acute Low Back Pain without Sciatica:  Xray lumbar spine today Referral placed for PT for further eval and treatment Refilled Ibuprofen and Methocarbamol Continue stretching, heat and massage  Return precautions discussed Nicki Reaper, NP

## 2018-09-26 NOTE — Patient Instructions (Signed)

## 2019-03-18 ENCOUNTER — Encounter: Payer: Self-pay | Admitting: Internal Medicine

## 2019-03-19 ENCOUNTER — Other Ambulatory Visit: Payer: Self-pay

## 2019-03-19 ENCOUNTER — Ambulatory Visit: Payer: BC Managed Care – PPO | Admitting: Internal Medicine

## 2019-03-19 ENCOUNTER — Encounter: Payer: Self-pay | Admitting: Internal Medicine

## 2019-03-19 VITALS — BP 104/70 | HR 81 | Temp 98.6°F | Ht 64.0 in | Wt 280.0 lb

## 2019-03-19 DIAGNOSIS — L732 Hidradenitis suppurativa: Secondary | ICD-10-CM | POA: Diagnosis not present

## 2019-03-19 MED ORDER — DOXYCYCLINE HYCLATE 100 MG PO TABS: 100.0000 mg | ORAL_TABLET | Freq: Two times a day (BID) | ORAL | 0 refills | Status: DC

## 2019-03-19 MED ORDER — MUPIROCIN 2 % EX OINT: 1.0000 "application " | TOPICAL_OINTMENT | Freq: Two times a day (BID) | CUTANEOUS | 0 refills | Status: DC

## 2019-03-19 NOTE — Patient Instructions (Signed)
 Hidradenitis Suppurativa Hidradenitis suppurativa is a long-term (chronic) skin disease. It is similar to a severe form of acne, but it affects areas of the body where acne would be unusual, especially areas of the body where skin rubs against skin and becomes moist. These include:  Underarms.  Groin.  Genital area.  Buttocks.  Upper thighs.  Breasts. Hidradenitis suppurativa may start out as small lumps or pimples caused by blocked sweat glands or hair follicles. Pimples may develop into deep sores that break open (rupture) and drain pus. Over time, affected areas of skin may thicken and become scarred. This condition is rare and does not spread from person to person (non-contagious). What are the causes? The exact cause of this condition is not known. It may be related to:  Female and female hormones.  An overactive disease-fighting system (immune system). The immune system may over-react to blocked hair follicles or sweat glands and cause swelling and pus-filled sores. What increases the risk? You are more likely to develop this condition if you:  Are female.  Are 11-55 years old.  Have a family history of hidradenitis suppurativa.  Have a personal history of acne.  Are overweight.  Smoke.  Take the medicine lithium. What are the signs or symptoms? The first symptoms are usually painful bumps in the skin, similar to pimples. The condition may get worse over time (progress), or it may only cause mild symptoms. If the disease progresses, symptoms may include:  Skin bumps getting bigger and growing deeper into the skin.  Bumps rupturing and draining pus.  Itchy, infected skin.  Skin getting thicker and scarred.  Tunnels under the skin (fistulas) where pus drains from a bump.  Pain during daily activities, such as pain during walking if your groin area is affected.  Emotional problems, such as stress or depression. This condition may affect your appearance and  your ability or willingness to wear certain clothes or do certain activities. How is this diagnosed? This condition is diagnosed by a health care provider who specializes in skin diseases (dermatologist). You may be diagnosed based on:  Your symptoms and medical history.  A physical exam.  Testing a pus sample for infection.  Blood tests. How is this treated? Your treatment will depend on how severe your symptoms are. The same treatment will not work for everybody with this condition. You may need to try several treatments to find what works best for you. Treatment may include:  Cleaning and bandaging (dressing) your wounds as needed.  Lifestyle changes, such as new skin care routines.  Taking medicines, such as: ? Antibiotics. ? Acne medicines. ? Medicines to reduce the activity of the immune system. ? A diabetes medicine (metformin). ? Birth control pills, for women. ? Steroids to reduce swelling and pain.  Working with a mental health care provider, if you experience emotional distress due to this condition. If you have severe symptoms that do not get better with medicine, you may need surgery. Surgery may involve:  Using a laser to clear the skin and remove hair follicles.  Opening and draining deep sores.  Removing the areas of skin that are diseased and scarred. Follow these instructions at home: Medicines   Take over-the-counter and prescription medicines only as told by your health care provider.  If you were prescribed an antibiotic medicine, take it as told by your health care provider. Do not stop taking the antibiotic even if your condition improves. Skin care  If you have open wounds,   cover them with a clean dressing as told by your health care provider. Keep wounds clean by washing them gently with soap and water when you bathe.  Do not shave the areas where you get hidradenitis suppurativa.  Do not wear deodorant.  Wear loose-fitting clothes.  Try to  avoid getting overheated or sweaty. If you get sweaty or wet, change into clean, dry clothes as soon as you can.  To help relieve pain and itchiness, cover sore areas with a warm, clean washcloth (warm compress) for 5-10 minutes as often as needed.  If told by your health care provider, take a bleach bath twice a week: ? Fill your bathtub halfway with water. ? Pour in  cup of unscented household bleach. ? Soak in the tub for 5-10 minutes. ? Only soak from the neck down. Avoid water on your face and hair. ? Shower to rinse off the bleach from your skin. General instructions  Learn as much as you can about your disease so that you have an active role in your treatment. Work closely with your health care provider to find treatments that work for you.  If you are overweight, work with your health care provider to lose weight as recommended.  Do not use any products that contain nicotine or tobacco, such as cigarettes and e-cigarettes. If you need help quitting, ask your health care provider.  If you struggle with living with this condition, talk with your health care provider or work with a mental health care provider as recommended.  Keep all follow-up visits as told by your health care provider. This is important. Where to find more information  Hidradenitis Suppurativa [employer].: https://www.hs-foundation.org/ Contact a health care provider if you have:  A flare-up of hidradenitis suppurativa.  A fever or chills.  Trouble controlling your symptoms at home.  Trouble doing your daily activities because of your symptoms.  Trouble dealing with emotional problems related to your condition. Summary  Hidradenitis suppurativa is a long-term (chronic) skin disease. It is similar to a severe form of acne, but it affects areas of the body where acne would be unusual.  The first symptoms are usually painful bumps in the skin, similar to pimples. The condition may get worse over time  (progress), or it may only cause mild symptoms.  If you have open wounds, cover them with a clean dressing as told by your health care provider. Keep wounds clean by washing them gently with soap and water when you bathe.  Besides skin care, treatment may include medicines, laser treatment, and surgery. This information is not intended to replace advice given to you by your health care provider. Make sure you discuss any questions you have with your health care provider. Document Released: 04/06/2004 Document Revised: 08/31/2017 Document Reviewed: 08/31/2017 Elsevier Patient Education  2020 [employer].  

## 2019-03-19 NOTE — Progress Notes (Signed)
Subjective:    Patient ID: Shelia Diaz, female    DOB: October 17, 1980, 38 y.o.   MRN: [id number]  HPI  Patient presents to the clinic today with c/o a cyst under her right breast.  She reports she has Hidradenitis Suppurativa and has recurrent cysts under her breasts that tend to flare around her menstruation time.  She reports pain from cyst but no drainage at this time.  She also has a non-healing cyst under her left breast that she has been putting Neosporin on and covering with a bandage.  This lesion has been there for a few months without change.  She has seen dermatology in the past (approx 10 years ago) but would like to see a new dermatologist this time.  Review of Systems  Past Medical History:  Diagnosis Date  . Allergy-induced asthma   . Chicken pox   . History of anemia     Current Outpatient Medications  Medication Sig Dispense Refill  . Calcium 500 MG CHEW Chew 1 each 3 (three) times daily by mouth.    . cetirizine (ZYRTEC) 10 MG tablet Take 10 mg daily as needed by mouth.    . clindamycin (CLINDAGEL) 1 % gel Apply topically.    . fluticasone (FLONASE) 50 MCG/ACT nasal spray Place 2 sprays into both nostrils daily. In each nostril 16 g 12  . Iron Carbonyl-Vitamin C-FOS (CHEWABLE IRON) 30-10-25 MG CHEW Chew by mouth.    . Multiple Vitamin (MULTIVITAMIN) capsule Take by mouth.    . nystatin (MYCOSTATIN/NYSTOP) powder Apply to affected area daily to BID    . Phentermine HCl 8 MG TABS 1 tablet.    . topiramate (TOPAMAX) 25 MG tablet 1 tablet.    . vitamin B-12 (CYANOCOBALAMIN) 1000 MCG tablet Take by mouth.    . doxycycline (VIBRA-TABS) 100 MG tablet Take 1 tablet (100 mg total) by mouth 2 (two) times daily. 20 tablet 0  . mupirocin ointment (BACTROBAN) 2 % Apply 1 application topically 2 (two) times daily. 22 g 0   No current facility-administered medications for this visit.     Allergies  Allergen Reactions  . Penicillins Hives    Family History  Problem  Relation Age of Onset  . Heart disease Mother   . Stroke Mother   . Hypertension Mother   . Heart disease Father   . Stroke Father   . Arthritis Paternal Grandfather     Social History   Socioeconomic History  . Marital status: Single    Spouse name: Not on file  . Number of children: Not on file  . Years of education: Not on file  . Highest education level: Not on file  Occupational History  . Not on file  Social Needs  . Financial resource strain: Not on file  . Food insecurity    Worry: Not on file    Inability: Not on file  . Transportation needs    Medical: Not on file    Non-medical: Not on file  Tobacco Use  . Smoking status: Never Smoker  . Smokeless tobacco: Never Used  Substance and Sexual Activity  . Alcohol use: No    Alcohol/week: 0.0 standard drinks  . Drug use: No  . Sexual activity: Yes  Lifestyle  . Physical activity    Days per week: Not on file    Minutes per session: Not on file  . Stress: Not on file  Relationships  . Social Musicianconnections    Talks on phone:  Not on file    Gets together: Not on file    Attends religious service: Not on file    Active member of club or organization: Not on file    Attends meetings of clubs or organizations: Not on file    Relationship status: Not on file  . Intimate partner violence    Fear of current or ex partner: Not on file    Emotionally abused: Not on file    Physically abused: Not on file    Forced sexual activity: Not on file  Other Topics Concern  . Not on file  Social History Narrative  . Not on file    General: Denies fever, malaise, fatigue. Skin: Complains of painful cyst under right breast without drainage and non healing cyst under left breast.  Positive history of hidradenitis suppurativa.  No other specific complaints in a complete review of systems (except as listed in HPI above).     Objective:   Physical Exam  BP 104/70 (BP Location: Left Wrist, Patient Position: Sitting, Cuff  Size: Normal)   Pulse 81   Temp 98.6 F (37 C) (Temporal)   Ht 5\' 4"  (1.626 m)   Wt 280 lb (127 kg)   SpO2 98%   BMI 48.06 kg/m  Wt Readings from Last 3 Encounters:  03/19/19 280 lb (127 kg)  09/26/18 283 lb (128.4 kg)  08/28/18 280 lb (127 kg)    General: Appears her stated age, obese in NAD. Skin: Small raised pink cyst under right breast with erythema and edema and without drainage.  Extensive scarring to bilateral breast tissue.  Under left breast there is an area of granulated tissue from previous cyst without drainage.    BMET    Component Value Date/Time   NA 133 (L) 07/18/2017 1621   K 4.3 07/18/2017 1621   CL 103 07/18/2017 1621   CO2 24 07/18/2017 1621   GLUCOSE 83 07/18/2017 1621   BUN 21 07/18/2017 1621   CREATININE 0.83 07/18/2017 1621   CALCIUM 9.1 07/18/2017 1621    Lipid Panel     Component Value Date/Time   CHOL 148 07/18/2017 1621   TRIG 44.0 07/18/2017 1621   HDL 49.70 07/18/2017 1621   CHOLHDL 3 07/18/2017 1621   VLDL 8.8 07/18/2017 1621   LDLCALC 89 07/18/2017 1621    CBC    Component Value Date/Time   WBC 6.1 07/18/2017 1621   RBC 5.24 (H) 07/18/2017 1621   HGB 14.6 07/18/2017 1621   HCT 45.2 07/18/2017 1621   PLT 328.0 07/18/2017 1621   MCV 86.2 07/18/2017 1621   MCHC 32.2 07/18/2017 1621   RDW 13.4 07/18/2017 1621    Hgb A1C Lab Results  Component Value Date   HGBA1C 5.5 07/18/2017            Assessment & Plan:   Hidradenitis Suppurativa:  Rx for Doxycycline 100 mg BID x 10 days RX for Bactroban ointment  sent to pharmacy to be applied to lesion under left breast BID to promote healing. Referral to dermatology completed to discuss further prevention/treatment.   Webb Silversmith, NP

## 2019-07-19 ENCOUNTER — Other Ambulatory Visit: Payer: Self-pay

## 2019-07-19 ENCOUNTER — Encounter: Payer: Self-pay | Admitting: Internal Medicine

## 2019-07-19 ENCOUNTER — Ambulatory Visit (INDEPENDENT_AMBULATORY_CARE_PROVIDER_SITE_OTHER): Payer: BC Managed Care – PPO | Admitting: Internal Medicine

## 2019-07-19 VITALS — BP 122/84 | HR 86 | Temp 98.0°F | Ht 64.0 in | Wt 288.0 lb

## 2019-07-19 DIAGNOSIS — J4520 Mild intermittent asthma, uncomplicated: Secondary | ICD-10-CM

## 2019-07-19 DIAGNOSIS — Z0000 Encounter for general adult medical examination without abnormal findings: Secondary | ICD-10-CM | POA: Diagnosis not present

## 2019-07-19 DIAGNOSIS — L732 Hidradenitis suppurativa: Secondary | ICD-10-CM | POA: Diagnosis not present

## 2019-07-19 NOTE — Assessment & Plan Note (Signed)
Continue Bactrim and creams

## 2019-07-19 NOTE — Patient Instructions (Signed)
Health Maintenance, Female Adopting a healthy lifestyle and getting preventive care are important in promoting health and wellness. Ask your health care provider about:  The right schedule for you to have regular tests and exams.  Things you can do on your own to prevent diseases and keep yourself healthy. What should I know about diet, weight, and exercise? Eat a healthy diet   Eat a diet that includes plenty of vegetables, fruits, low-fat dairy products, and lean protein.  Do not eat a lot of foods that are high in solid fats, added sugars, or sodium. Maintain a healthy weight Body mass index (BMI) is used to identify weight problems. It estimates body fat based on height and weight. Your health care provider can help determine your BMI and help you achieve or maintain a healthy weight. Get regular exercise Get regular exercise. This is one of the most important things you can do for your health. Most adults should:  Exercise for at least 150 minutes each week. The exercise should increase your heart rate and make you sweat (moderate-intensity exercise).  Do strengthening exercises at least twice a week. This is in addition to the moderate-intensity exercise.  Spend less time sitting. Even light physical activity can be beneficial. Watch cholesterol and blood lipids Have your blood tested for lipids and cholesterol at 39 years of age, then have this test every 5 years. Have your cholesterol levels checked more often if:  Your lipid or cholesterol levels are high.  You are older than 40 years of age.  You are at high risk for heart disease. What should I know about cancer screening? Depending on your health history and family history, you may need to have cancer screening at various ages. This may include screening for:  Breast cancer.  Cervical cancer.  Colorectal cancer.  Skin cancer.  Lung cancer. What should I know about heart disease, diabetes, and high blood  pressure? Blood pressure and heart disease  High blood pressure causes heart disease and increases the risk of stroke. This is more likely to develop in people who have high blood pressure readings, are of African descent, or are overweight.  Have your blood pressure checked: ? Every 3-5 years if you are 18-39 years of age. ? Every year if you are 40 years old or older. Diabetes Have regular diabetes screenings. This checks your fasting blood sugar level. Have the screening done:  Once every three years after age 40 if you are at a normal weight and have a low risk for diabetes.  More often and at a younger age if you are overweight or have a high risk for diabetes. What should I know about preventing infection? Hepatitis B If you have a higher risk for hepatitis B, you should be screened for this virus. Talk with your health care provider to find out if you are at risk for hepatitis B infection. Hepatitis C Testing is recommended for:  Everyone born from 1945 through 1965.  Anyone with known risk factors for hepatitis C. Sexually transmitted infections (STIs)  Get screened for STIs, including gonorrhea and chlamydia, if: ? You are sexually active and are younger than 39 years of age. ? You are older than 39 years of age and your health care provider tells you that you are at risk for this type of infection. ? Your sexual activity has changed since you were last screened, and you are at increased risk for chlamydia or gonorrhea. Ask your health care provider if   you are at risk.  Ask your health care provider about whether you are at high risk for HIV. Your health care provider may recommend a prescription medicine to help prevent HIV infection. If you choose to take medicine to prevent HIV, you should first get tested for HIV. You should then be tested every 3 months for as long as you are taking the medicine. Pregnancy  If you are about to stop having your period (premenopausal) and  you may become pregnant, seek counseling before you get pregnant.  Take 400 to 800 micrograms (mcg) of folic acid every day if you become pregnant.  Ask for birth control (contraception) if you want to prevent pregnancy. Osteoporosis and menopause Osteoporosis is a disease in which the bones lose minerals and strength with aging. This can result in bone fractures. If you are 65 years old or older, or if you are at risk for osteoporosis and fractures, ask your health care provider if you should:  Be screened for bone loss.  Take a calcium or vitamin D supplement to lower your risk of fractures.  Be given hormone replacement therapy (HRT) to treat symptoms of menopause. Follow these instructions at home: Lifestyle  Do not use any products that contain nicotine or tobacco, such as cigarettes, e-cigarettes, and chewing tobacco. If you need help quitting, ask your health care provider.  Do not use street drugs.  Do not share needles.  Ask your health care provider for help if you need support or information about quitting drugs. Alcohol use  Do not drink alcohol if: ? Your health care provider tells you not to drink. ? You are pregnant, may be pregnant, or are planning to become pregnant.  If you drink alcohol: ? Limit how much you use to 0-1 drink a day. ? Limit intake if you are breastfeeding.  Be aware of how much alcohol is in your drink. In the [place] one drink equals one 12 oz bottle of beer (355 mL), one 5 oz glass of wine (148 mL), or one 1 oz glass of hard liquor (44 mL). General instructions  Schedule regular health, dental, and eye exams.  Stay current with your vaccines.  Tell your health care provider if: ? You often feel depressed. ? You have ever been abused or do not feel safe at home. Summary  Adopting a healthy lifestyle and getting preventive care are important in promoting health and wellness.  Follow your health care provider's instructions about healthy  diet, exercising, and getting tested or screened for diseases.  Follow your health care provider's instructions on monitoring your cholesterol and blood pressure. This information is not intended to replace advice given to you by your health care provider. Make sure you discuss any questions you have with your health care provider. Document Released: 03/08/2011 Document Revised: 08/16/2018 Document Reviewed: 08/16/2018 Elsevier Patient Education  2020 [employer]  

## 2019-07-19 NOTE — Assessment & Plan Note (Signed)
Continue Zyrtec Will monitor

## 2019-07-19 NOTE — Progress Notes (Signed)
Subjective:    Patient ID: Shelia Diaz, female    DOB: 1981-01-06, 38 y.o.   MRN: [id number]  HPI  Pt presents to the clinic today for her annual exam. She is also due for follow up of chronic conditions.   Hidradenitis Suppurativa: She mentions that stress and menstrual cycles cause flare-ups.  Controlled on Bactrim and topica cream. Follows with dermatology.   Allergy induced asthma: Intermittent and seasonal. Controlled on OTC Zyrtec PRN   Flu: 06/2015 Tetanus: 08/2015 Pap Smear: 07/2017 Dentist: as needed Vision: as needed   Diet: She eats meats. She consumes fruits and veggies daily. She eats fried foods occasionally but more baked. She drinks mostly water.  Exercise: none, recently takes Zumba 1-2 times a week and trying to back into habit of working out at home.   Review of Systems  Past Medical History:  Diagnosis Date  . Allergy-induced asthma   . Chicken pox   . History of anemia     Current Outpatient Medications  Medication Sig Dispense Refill  . Calcium 500 MG CHEW Chew 1 each 3 (three) times daily by mouth.    . cetirizine (ZYRTEC) 10 MG tablet Take 10 mg daily as needed by mouth.    . clindamycin (CLINDAGEL) 1 % gel Apply topically.    Marland Kitchen. doxycycline (VIBRA-TABS) 100 MG tablet Take 1 tablet (100 mg total) by mouth 2 (two) times daily. 20 tablet 0  . fluticasone (FLONASE) 50 MCG/ACT nasal spray Place 2 sprays into both nostrils daily. In each nostril 16 g 12  . Iron Carbonyl-Vitamin C-FOS (CHEWABLE IRON) 30-10-25 MG CHEW Chew by mouth.    . Multiple Vitamin (MULTIVITAMIN) capsule Take by mouth.    . mupirocin ointment (BACTROBAN) 2 % Apply 1 application topically 2 (two) times daily. 22 g 0  . nystatin (MYCOSTATIN/NYSTOP) powder Apply to affected area daily to BID    . Phentermine HCl 8 MG TABS 1 tablet.    . topiramate (TOPAMAX) 25 MG tablet 1 tablet.    . vitamin B-12 (CYANOCOBALAMIN) 1000 MCG tablet Take by mouth.     No current  facility-administered medications for this visit.     Allergies  Allergen Reactions  . Penicillins Hives    Family History  Problem Relation Age of Onset  . Heart disease Mother   . Stroke Mother   . Hypertension Mother   . Heart disease Father   . Stroke Father   . Arthritis Paternal Grandfather     Social History   Socioeconomic History  . Marital status: Single    Spouse name: Not on file  . Number of children: Not on file  . Years of education: Not on file  . Highest education level: Not on file  Occupational History  . Not on file  Social Needs  . Financial resource strain: Not on file  . Food insecurity    Worry: Not on file    Inability: Not on file  . Transportation needs    Medical: Not on file    Non-medical: Not on file  Tobacco Use  . Smoking status: Never Smoker  . Smokeless tobacco: Never Used  Substance and Sexual Activity  . Alcohol use: No    Alcohol/week: 0.0 standard drinks  . Drug use: No  . Sexual activity: Yes  Lifestyle  . Physical activity    Days per week: Not on file    Minutes per session: Not on file  . Stress: Not on file  Relationships  . Social Musician on phone: Not on file    Gets together: Not on file    Attends religious service: Not on file    Active member of club or organization: Not on file    Attends meetings of clubs or organizations: Not on file    Relationship status: Not on file  . Intimate partner violence    Fear of current or ex partner: Not on file    Emotionally abused: Not on file    Physically abused: Not on file    Forced sexual activity: Not on file  Other Topics Concern  . Not on file  Social History Narrative  . Not on file     Constitutional: Denies fever, malaise, fatigue, headache or abrupt weight changes.  HEENT: Denies eye pain, eye redness, ear pain, ringing in the ears, wax buildup, runny nose, nasal congestion, bloody nose, or sore throat. Respiratory: Denies difficulty  breathing, shortness of breath, cough or sputum production.   Cardiovascular: Denies chest pain, chest tightness, palpitations or swelling in the hands or feet.  Gastrointestinal: Denies abdominal pain, bloating, constipation, diarrhea or blood in the stool.  GU: Denies urgency, frequency, pain with urination, burning sensation, blood in urine, odor or discharge. Musculoskeletal: Denies decrease in range of motion, difficulty with gait, muscle pain or joint pain and swelling.  Skin: Pt reports cysts in bilateral axilla and under breast. Denies redness, rashes, or ulcercations.  Neurological: Denies dizziness, difficulty with memory, difficulty with speech or problems with balance and coordination.  Psych: Denies anxiety, depression, SI/HI.  No other specific complaints in a complete review of systems (except as listed in HPI above).     Objective:   Physical Exam  BP 122/84   Pulse 86   Temp 98 F (36.7 C) (Temporal)   Ht 5\' 4"  (1.626 m)   Wt 288 lb (130.6 kg)   SpO2 98%   BMI 49.44 kg/m   Wt Readings from Last 3 Encounters:  03/19/19 280 lb (127 kg)  09/26/18 283 lb (128.4 kg)  08/28/18 280 lb (127 kg)    General: Appears her stated age,  Obese, in NAD. Skin: Warm, dry and intact. HEENT: Head: normal shape and size; Eyes: sclera white, no icterus, conjunctiva pink and EOMs intact Neck:  Neck supple, trachea midline. No masses, lumps or thyromegaly present.  Cardiovascular: Normal rate and rhythm. S1,S2 noted.  No murmur, rubs or gallops noted. No JVD or BLE edema.  Pulmonary/Chest: Normal effort and positive vesicular breath sounds. No respiratory distress. No wheezes, rales or ronchi noted.  Abdomen: Soft and nontender. Normal bowel sounds. No distention or masses noted. Liver, spleen and kidneys non palpable. Musculoskeletal: Strength 5/5 BUE/BLE. No difficulty with gait.  Neurological: Alert and oriented. Cranial nerves II-XII grossly intact. Coordination normal.   Psychiatric: Mood and affect normal. Behavior is normal. Judgment and thought content normal.     BMET    Component Value Date/Time   NA 133 (L) 07/18/2017 1621   K 4.3 07/18/2017 1621   CL 103 07/18/2017 1621   CO2 24 07/18/2017 1621   GLUCOSE 83 07/18/2017 1621   BUN 21 07/18/2017 1621   CREATININE 0.83 07/18/2017 1621   CALCIUM 9.1 07/18/2017 1621    Lipid Panel     Component Value Date/Time   CHOL 148 07/18/2017 1621   TRIG 44.0 07/18/2017 1621   HDL 49.70 07/18/2017 1621   CHOLHDL 3 07/18/2017 1621   VLDL 8.8  07/18/2017 1621   LDLCALC 89 07/18/2017 1621    CBC    Component Value Date/Time   WBC 6.1 07/18/2017 1621   RBC 5.24 (H) 07/18/2017 1621   HGB 14.6 07/18/2017 1621   HCT 45.2 07/18/2017 1621   PLT 328.0 07/18/2017 1621   MCV 86.2 07/18/2017 1621   MCHC 32.2 07/18/2017 1621   RDW 13.4 07/18/2017 1621    Hgb A1C Lab Results  Component Value Date   HGBA1C 5.5 07/18/2017           Assessment & Plan:  Preventative Health Maintenance:  She declines flu shot today Tetanus UTD Pap Smear UTD Encouraged patient to see a dentist and vision checked annually Encouraged patient to eat a healthy and balanced diet Ordered CBC, CMET,  Lipid, A1C, Vit D and lipid panel  RTC in one year, sooner if needed  Webb Silversmith, NP

## 2019-07-20 LAB — COMPREHENSIVE METABOLIC PANEL
ALT: 12 U/L (ref 0–35)
AST: 14 U/L (ref 0–37)
Albumin: 3.9 g/dL (ref 3.5–5.2)
Alkaline Phosphatase: 77 U/L (ref 39–117)
BUN: 23 mg/dL (ref 6–23)
CO2: 23 mEq/L (ref 19–32)
Calcium: 9.1 mg/dL (ref 8.4–10.5)
Chloride: 105 mEq/L (ref 96–112)
Creatinine, Ser: 0.84 mg/dL (ref 0.40–1.20)
GFR: 91.23 mL/min (ref >60.00)
Glucose, Bld: 88 mg/dL (ref 70–99)
Potassium: 4.1 mEq/L (ref 3.5–5.1)
Sodium: 136 mEq/L (ref 135–145)
Total Bilirubin: 0.3 mg/dL (ref 0.2–1.2)
Total Protein: 7.4 g/dL (ref 6.0–8.3)

## 2019-07-20 LAB — CBC
HCT: 44.2 % (ref 36.0–46.0)
Hemoglobin: 14.4 g/dL (ref 12.0–15.0)
MCHC: 32.6 g/dL (ref 30.0–36.0)
MCV: 88.4 fl (ref 78.0–100.0)
Platelets: 323 10*3/uL (ref 150.0–400.0)
RBC: 5 Mil/uL (ref 3.87–5.11)
RDW: 13.5 % (ref 11.5–15.5)
WBC: 6.8 10*3/uL (ref 4.0–10.5)

## 2019-07-20 LAB — LIPID PANEL
Cholesterol: 168 mg/dL (ref 0–200)
HDL: 58.6 mg/dL (ref >39.00)
LDL Cholesterol: 100 mg/dL — ABNORMAL HIGH (ref 0–99)
NonHDL: 109.22
Total CHOL/HDL Ratio: 3
Triglycerides: 48 mg/dL (ref 0.0–149.0)
VLDL: 9.6 mg/dL (ref 0.0–40.0)

## 2019-07-20 LAB — HEMOGLOBIN A1C: Hgb A1c MFr Bld: 5.5 % (ref 4.6–6.5)

## 2019-09-21 ENCOUNTER — Ambulatory Visit: Payer: BC Managed Care – PPO | Attending: Internal Medicine

## 2019-09-21 DIAGNOSIS — Z20822 Contact with and (suspected) exposure to covid-19: Secondary | ICD-10-CM

## 2019-09-22 LAB — NOVEL CORONAVIRUS, NAA: SARS-CoV-2, NAA: NOT DETECTED

## 2019-10-18 ENCOUNTER — Other Ambulatory Visit: Payer: BC Managed Care – PPO

## 2019-10-19 ENCOUNTER — Other Ambulatory Visit: Payer: BC Managed Care – PPO

## 2019-10-22 ENCOUNTER — Other Ambulatory Visit: Payer: BC Managed Care – PPO

## 2019-12-20 ENCOUNTER — Ambulatory Visit: Payer: BC Managed Care – PPO

## 2019-12-20 ENCOUNTER — Ambulatory Visit: Payer: BC Managed Care – PPO | Attending: Internal Medicine

## 2019-12-20 DIAGNOSIS — Z23 Encounter for immunization: Secondary | ICD-10-CM

## 2019-12-20 NOTE — Progress Notes (Signed)
   Covid-19 Vaccination Clinic  Name:  Shelia Diaz    MRN: [id number] DOB: 08-09-1981  12/20/2019  [name] was observed post Covid-19 immunization for 15 minutes without incident. She was provided with Vaccine Information Sheet and instruction to access the V-Safe system.   [name] was instructed to call 911 with any severe reactions post vaccine: Marland Kitchen Difficulty breathing  . Swelling of face and throat  . A fast heartbeat  . A bad rash all over body  . Dizziness and weakness   Immunizations Administered    Name Date Dose VIS Date Route   Pfizer COVID-19 Vaccine 12/20/2019  3:25 PM 0.3 mL 08/17/2019 Intramuscular   Manufacturer: [employer], [employer]   Lot: [id number]   NDC: [address]-4

## 2020-01-14 ENCOUNTER — Ambulatory Visit: Payer: BC Managed Care – PPO | Attending: Internal Medicine

## 2020-01-14 DIAGNOSIS — Z23 Encounter for immunization: Secondary | ICD-10-CM

## 2020-01-14 NOTE — Progress Notes (Signed)
   Covid-19 Vaccination Clinic  Name:  Shelia Diaz    MRN: [id number] DOB: August 21, 1981  01/14/2020  Shelia Diaz was observed post Covid-19 immunization for 15 minutes without incident. She was provided with Vaccine Information Sheet and instruction to access the V-Safe system.   Shelia Diaz was instructed to call 911 with any severe reactions post vaccine: Marland Kitchen Difficulty breathing  . Swelling of face and throat  . A fast heartbeat  . A bad rash all over body  . Dizziness and weakness   Immunizations Administered    Name Date Dose VIS Date Route   Pfizer COVID-19 Vaccine 01/14/2020  3:28 PM 0.3 mL 10/31/2018 Intramuscular   Manufacturer: [employer], [employer]   Lot: [id number]   NDC: [address]-9

## 2020-06-19 ENCOUNTER — Other Ambulatory Visit: Payer: BC Managed Care – PPO

## 2020-06-19 DIAGNOSIS — Z20822 Contact with and (suspected) exposure to covid-19: Secondary | ICD-10-CM

## 2020-06-20 ENCOUNTER — Encounter: Payer: Self-pay | Admitting: Family Medicine

## 2020-06-20 ENCOUNTER — Telehealth (INDEPENDENT_AMBULATORY_CARE_PROVIDER_SITE_OTHER): Payer: BC Managed Care – PPO | Admitting: Family Medicine

## 2020-06-20 ENCOUNTER — Other Ambulatory Visit: Payer: Self-pay

## 2020-06-20 VITALS — Temp 98.0°F | Ht 64.0 in | Wt 298.0 lb

## 2020-06-20 DIAGNOSIS — J019 Acute sinusitis, unspecified: Secondary | ICD-10-CM

## 2020-06-20 DIAGNOSIS — B9789 Other viral agents as the cause of diseases classified elsewhere: Secondary | ICD-10-CM | POA: Diagnosis not present

## 2020-06-20 LAB — SARS-COV-2, NAA 2 DAY TAT

## 2020-06-20 LAB — NOVEL CORONAVIRUS, NAA: SARS-CoV-2, NAA: NOT DETECTED

## 2020-06-20 NOTE — Assessment & Plan Note (Signed)
Neg rapid COVID test. Start Flonase 2 sprays per nostril daily.  Start plain mucinex twice a day.Marland Kitchen avoid decongestants.  Start nasal saline spray or irrigation.   If not improving in 5-7 days.. call for re-evaluation.  Consider PCR COVID test to make sure if not improving

## 2020-06-20 NOTE — Progress Notes (Signed)
VIRTUAL VISIT Due to national recommendations of social distancing due to COVID 19, a virtual visit is felt to be most appropriate for this patient at this time.   I connected with the patient on 06/20/20 at  9:20 AM EDT by virtual telehealth platform and verified that I am speaking with the correct person using two identifiers.   I discussed the limitations, risks, security and privacy concerns of performing an evaluation and management service by  virtual telehealth platform and the availability of in person appointments. I also discussed with the patient that there may be a patient responsible charge related to this service. The patient expressed understanding and agreed to proceed.  Patient location: Home Provider Location: [facility] Participants: Kerby Nora and Fatima Sanger   Chief Complaint  Patient presents with  . URI    Neg Rapid Covid yesterday. Sore throat, ears itchy, nasal drainage    History of Present Illness:  39 year old female presents with new onset  ST, itchy ears and runny nose noted in last 5 days. Started  occ coughing yesterday, yellowish mucus, small blood streaks in mucus.  Facial pressure, bilateral.  No ear pain.  No fever, no SOB. No body ache, no diarrhea Neg Rapid COVID test yesterday.   Hx of allergy induced asthma.  no current wheeze.  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   Throat lozanges have not helped.. has not tried anything else.   COVID 19 screen No recent travel or known exposure to COVID19 .  The importance of social distancing was discussed today.   Review of Systems  Constitutional: Negative for chills and fever.  HENT: Positive for congestion, sinus pain and sore throat. Negative for ear pain.   Eyes: Negative for pain and redness.  Respiratory: Positive for cough. Negative for shortness of breath.   Cardiovascular: Negative for chest pain, palpitations and leg swelling.   Gastrointestinal: Negative for abdominal pain, blood in stool, constipation, diarrhea, nausea and vomiting.  Genitourinary: Negative for dysuria.  Musculoskeletal: Negative for falls and myalgias.  Skin: Negative for rash.  Neurological: Negative for dizziness.  Psychiatric/Behavioral: Negative for depression. The patient is not nervous/anxious.       Past Medical History:  Diagnosis Date  . Allergy-induced asthma   . Chicken pox   . History of anemia     reports that she has never smoked. She has never used smokeless tobacco. She reports that she does not drink alcohol and does not use drugs.   Current Outpatient Medications:  .  Calcium 500 MG CHEW, Chew 1 each 3 (three) times daily by mouth., Disp: , Rfl:  .  cetirizine (ZYRTEC) 10 MG tablet, Take 10 mg daily as needed by mouth., Disp: , Rfl:  .  clindamycin (CLINDAGEL) 1 % gel, Apply topically., Disp: , Rfl:  .  Iron Carbonyl-Vitamin C-FOS (CHEWABLE IRON) 30-10-25 MG CHEW, Chew by mouth., Disp: , Rfl:  .  Multiple Vitamin (MULTIVITAMIN) capsule, Take by mouth., Disp: , Rfl:  .  mupirocin ointment (BACTROBAN) 2 %, Apply 1 application topically 2 (two) times daily., Disp: 22 g, Rfl: 0 .  nystatin (MYCOSTATIN/NYSTOP) powder, Apply to affected area daily to BID, Disp: , Rfl:  .  Phentermine HCl 8 MG TABS, 1 tablet., Disp: , Rfl:  .  topiramate (TOPAMAX) 25 MG tablet, 1 tablet., Disp: , Rfl:  .  vitamin B-12 (CYANOCOBALAMIN) 1000 MCG tablet, Take by mouth., Disp: ,  Rfl:    Observations/Objective: Temperature 98 F (36.7 C), height 5\' 4"  (1.626 m), weight 298 lb (135.2 kg).  Physical Exam  Physical Exam Constitutional:      General: The patient is not in acute distress. Pulmonary:     Effort: Pulmonary effort is normal. No respiratory distress.  Neurological:     Mental Status: The patient is alert and oriented to person, place, and time.  Psychiatric:        Mood and Affect: Mood normal.        Behavior: Behavior normal.     Assessment and Plan Acute viral sinusitis Neg rapid COVID test. Start Flonase 2 sprays per nostril daily.  Start plain mucinex twice a day. avoid decongestants.  Start nasal saline spray or irrigation.   If not improving in 5-7 days.. call for re-evaluation.  Consider PCR COVID test to make sure if not improving      I discussed the assessment and treatment plan with the patient. The patient was provided an opportunity to ask questions and all were answered. The patient agreed with the plan and demonstrated an understanding of the instructions.   The patient was advised to call back or seek an in-person evaluation if the symptoms worsen or if the condition fails to improve as anticipated.     Marland Kitchen, MD

## 2020-06-20 NOTE — Patient Instructions (Signed)
Start Flonase 2 sprays per nostril daily.  Start plain mucinex twice a day.Marland Kitchen avoid decongestants.  Start nasal saline spray or irrigation.   If not improving in 5-7 days.. call for re-evaluation.  Consider PCR COVID test to make sure if not improving

## 2020-08-28 ENCOUNTER — Other Ambulatory Visit: Payer: Self-pay

## 2020-08-28 ENCOUNTER — Encounter: Payer: Self-pay | Admitting: Internal Medicine

## 2020-08-28 ENCOUNTER — Ambulatory Visit: Payer: BC Managed Care – PPO | Admitting: Internal Medicine

## 2020-08-28 VITALS — BP 110/70 | HR 72 | Temp 97.7°F | Ht 64.0 in | Wt 304.0 lb

## 2020-08-28 DIAGNOSIS — H6981 Other specified disorders of Eustachian tube, right ear: Secondary | ICD-10-CM | POA: Diagnosis not present

## 2020-08-28 NOTE — Progress Notes (Signed)
Subjective:    Patient ID: Shelia Diaz, female    DOB: Aug 10, 1981, 39 y.o.   MRN: [id number]  HPI  Pt presents to the clinic today with c/o ear pain. This started 2 days ago. She describes the pain as discomfort. She has had some ringing and popping but no drainage or loss of hearing. She does have some associated nasal congestion, not able to blow anything out of her nose. She denies headaches, visual changes runny nose, sore throat, cough. She denies fever, chills or body aches. She has tried Tylenol with some relief of symptoms.  Review of Systems  Past Medical History:  Diagnosis Date  . Allergy-induced asthma   . Chicken pox   . History of anemia     Current Outpatient Medications  Medication Sig Dispense Refill  . Calcium 500 MG CHEW Chew 1 each 3 (three) times daily by mouth.    . cetirizine (ZYRTEC) 10 MG tablet Take 10 mg daily as needed by mouth.    . clindamycin (CLINDAGEL) 1 % gel Apply topically.    . Iron Carbonyl-Vitamin C-FOS (CHEWABLE IRON) 30-10-25 MG CHEW Chew by mouth.    . Multiple Vitamin (MULTIVITAMIN) capsule Take by mouth.    . mupirocin ointment (BACTROBAN) 2 % Apply 1 application topically 2 (two) times daily. 22 g 0  . nystatin (MYCOSTATIN/NYSTOP) powder Apply to affected area daily to BID    . Phentermine HCl 8 MG TABS 1 tablet.    . topiramate (TOPAMAX) 25 MG tablet 1 tablet.    . vitamin B-12 (CYANOCOBALAMIN) 1000 MCG tablet Take by mouth.     No current facility-administered medications for this visit.    Allergies  Allergen Reactions  . Penicillins Hives    Family History  Problem Relation Age of Onset  . Heart disease Mother   . Stroke Mother   . Hypertension Mother   . Heart disease Father   . Stroke Father   . Arthritis Paternal Grandfather     Social History   Socioeconomic History  . Marital status: Single    Spouse name: Not on file  . Number of children: Not on file  . Years of education: Not on file  . Highest  education level: Not on file  Occupational History  . Not on file  Tobacco Use  . Smoking status: Never Smoker  . Smokeless tobacco: Never Used  Substance and Sexual Activity  . Alcohol use: No    Alcohol/week: 0.0 standard drinks  . Drug use: No  . Sexual activity: Yes  Other Topics Concern  . Not on file  Social History Narrative  . Not on file   Social Determinants of Health   Financial Resource Strain: Not on file  Food Insecurity: Not on file  Transportation Needs: Not on file  Physical Activity: Not on file  Stress: Not on file  Social Connections: Not on file  Intimate Partner Violence: Not on file     Constitutional: Denies fever, malaise, fatigue, headache or abrupt weight changes.  HEENT: Pt reports ear pain, nasal congestion. Denies eye pain, eye redness, ringing in the ears, wax buildup, runny nose,bloody nose, or sore throat. Respiratory: Denies difficulty breathing, shortness of breath, cough or sputum production.   Cardiovascular: Denies chest pain, chest tightness, palpitations or swelling in the hands or feet.  Neurological: Denies dizziness, difficulty with memory, difficulty with speech or problems with balance and coordination.    No other specific complaints in a complete review  of systems (except as listed in HPI above).     Objective:   Physical Exam  BP 110/70 (BP Location: Left Arm, Patient Position: Sitting, Cuff Size: Large)   Pulse 72   Temp 97.7 F (36.5 C)   Ht 5\' 4"  (1.626 m)   Wt (!) 304 lb (137.9 kg)   SpO2 98%   BMI 52.18 kg/m   Wt Readings from Last 3 Encounters:  06/20/20 298 lb (135.2 kg)  07/19/19 288 lb (130.6 kg)  03/19/19 280 lb (127 kg)    General: Appears her stated age, obese in NAD. HEENT: Head: normal shape and size; Eyes: sclera white, no icterus, conjunctiva pink, PERRLA and EOMs intact; Ears: Tm's gray and intact, normal light reflex, + serous effusion on the right; Neck:  No adenopathy noted. Cardiovascular:  Normal rate. Pulmonary/Chest: Normal effort. Neurological: Alert and oriented. Coordination normal.    BMET    Component Value Date/Time   NA 136 07/19/2019 1505   K 4.1 07/19/2019 1505   CL 105 07/19/2019 1505   CO2 23 07/19/2019 1505   GLUCOSE 88 07/19/2019 1505   BUN 23 07/19/2019 1505   CREATININE 0.84 07/19/2019 1505   CALCIUM 9.1 07/19/2019 1505    Lipid Panel     Component Value Date/Time   CHOL 168 07/19/2019 1505   TRIG 48.0 07/19/2019 1505   HDL 58.60 07/19/2019 1505   CHOLHDL 3 07/19/2019 1505   VLDL 9.6 07/19/2019 1505   LDLCALC 100 (H) 07/19/2019 1505    CBC    Component Value Date/Time   WBC 6.8 07/19/2019 1505   RBC 5.00 07/19/2019 1505   HGB 14.4 07/19/2019 1505   HCT 44.2 07/19/2019 1505   PLT 323.0 07/19/2019 1505   MCV 88.4 07/19/2019 1505   MCHC 32.6 07/19/2019 1505   RDW 13.5 07/19/2019 1505    Hgb A1C Lab Results  Component Value Date   HGBA1C 5.5 07/19/2019            Assessment & Plan:   ETD, Right:  She will try Flonase 1 spray right nostril BID x 3 days then daily prn thereafter If no improvement, consider oral pred taper  Return precautions discussed 13/08/2019, NP This visit occurred during the SARS-CoV-2 public health emergency.  Safety protocols were in place, including screening questions prior to the visit, additional usage of staff PPE, and extensive cleaning of exam room while observing appropriate contact time as indicated for disinfecting solutions.

## 2020-08-28 NOTE — Patient Instructions (Signed)
Eustachian Tube Dysfunction ° °Eustachian tube dysfunction refers to a condition in which a blockage develops in the narrow passage that connects the middle ear to the back of the nose (eustachian tube). The eustachian tube regulates air pressure in the middle ear by letting air move between the ear and nose. It also helps to drain fluid from the middle ear space. °Eustachian tube dysfunction can affect one or both ears. When the eustachian tube does not function properly, air pressure, fluid, or both can build up in the middle ear. °What are the causes? °This condition occurs when the eustachian tube becomes blocked or cannot open normally. Common causes of this condition include: °· Ear infections. °· Colds and other infections that affect the nose, mouth, and throat (upper respiratory tract). °· Allergies. °· Irritation from cigarette smoke. °· Irritation from stomach acid coming up into the esophagus (gastroesophageal reflux). The esophagus is the tube that carries food from the mouth to the stomach. °· Sudden changes in air pressure, such as from descending in an airplane or scuba diving. °· Abnormal growths in the nose or throat, such as: °? Growths that line the nose (nasal polyps). °? Abnormal growth of cells (tumors). °? Enlarged tissue at the back of the throat (adenoids). °What increases the risk? °You are more likely to develop this condition if: °· You smoke. °· You are overweight. °· You are a child who has: °? Certain birth defects of the mouth, such as cleft palate. °? Large tonsils or adenoids. °What are the signs or symptoms? °Common symptoms of this condition include: °· A feeling of fullness in the ear. °· Ear pain. °· Clicking or popping noises in the ear. °· Ringing in the ear. °· Hearing loss. °· Loss of balance. °· Dizziness. °Symptoms may get worse when the air pressure around you changes, such as when you travel to an area of high elevation, fly on an airplane, or go scuba diving. °How is  this diagnosed? °This condition may be diagnosed based on: °· Your symptoms. °· A physical exam of your ears, nose, and throat. °· Tests, such as those that measure: °? The movement of your eardrum (tympanogram). °? Your hearing (audiometry). °How is this treated? °Treatment depends on the cause and severity of your condition. °· In mild cases, you may relieve your symptoms by moving air into your ears. This is called "popping the ears." °· In more severe cases, or if you have symptoms of fluid in your ears, treatment may include: °? Medicines to relieve congestion (decongestants). °? Medicines that treat allergies (antihistamines). °? Nasal sprays or ear drops that contain medicines that reduce swelling (steroids). °? A procedure to drain the fluid in your eardrum (myringotomy). In this procedure, a small tube is placed in the eardrum to: °§ Drain the fluid. °§ Restore the air in the middle ear space. °? A procedure to insert a balloon device through the nose to inflate the opening of the eustachian tube (balloon dilation). °Follow these instructions at home: °Lifestyle °· Do not do any of the following until your health care provider approves: °? Travel to high altitudes. °? Fly in airplanes. °? Work in a pressurized cabin or room. °? Scuba dive. °· Do not use any products that contain nicotine or tobacco, such as cigarettes and e-cigarettes. If you need help quitting, ask your health care provider. °· Keep your ears dry. Wear fitted earplugs during showering and bathing. Dry your ears completely after. °General instructions °· Take over-the-counter   and prescription medicines only as told by your health care provider. °· Use techniques to help pop your ears as recommended by your health care provider. These may include: °? Chewing gum. °? Yawning. °? Frequent, forceful swallowing. °? Closing your mouth, holding your nose closed, and gently blowing as if you are trying to blow air out of your nose. °· Keep all  follow-up visits as told by your health care provider. This is important. °Contact a health care provider if: °· Your symptoms do not go away after treatment. °· Your symptoms come back after treatment. °· You are unable to pop your ears. °· You have: °? A fever. °? Pain in your ear. °? Pain in your head or neck. °? Fluid draining from your ear. °· Your hearing suddenly changes. °· You become very dizzy. °· You lose your balance. °Summary °· Eustachian tube dysfunction refers to a condition in which a blockage develops in the eustachian tube. °· It can be caused by ear infections, allergies, inhaled irritants, or abnormal growths in the nose or throat. °· Symptoms include ear pain, hearing loss, or ringing in the ears. °· Mild cases are treated with maneuvers to unblock the ears, such as yawning or ear popping. °· Severe cases are treated with medicines. Surgery may also be done (rare). °This information is not intended to replace advice given to you by your health care provider. Make sure you discuss any questions you have with your health care provider. °Document Revised: 12/13/2017 Document Reviewed: 12/13/2017 °Elsevier Patient Education © 2020 [employer] Inc. ° °

## 2020-08-31 ENCOUNTER — Ambulatory Visit
Admission: EM | Admit: 2020-08-31 | Discharge: 2020-08-31 | Disposition: A | Discharge status: Home / Self Care | Payer: BC Managed Care – PPO | Attending: Emergency Medicine | Admitting: Emergency Medicine

## 2020-08-31 ENCOUNTER — Other Ambulatory Visit: Payer: Self-pay

## 2020-08-31 DIAGNOSIS — H60311 Diffuse otitis externa, right ear: Secondary | ICD-10-CM

## 2020-08-31 MED ORDER — CEFDINIR 300 MG PO CAPS: 300.0000 mg | ORAL_CAPSULE | Freq: Two times a day (BID) | ORAL | 0 refills | Status: AC

## 2020-08-31 MED ORDER — CIPROFLOXACIN-DEXAMETHASONE 0.3-0.1 % OT SUSP: 4.0000 [drp] | Freq: Two times a day (BID) | OTIC | 0 refills | Status: AC

## 2020-08-31 NOTE — Discharge Instructions (Signed)
Begin Omnicef twice daily for the next 10 days Ciprodex drops twice daily for the next week Ibuprofen and Tylenol for pain and swelling Keep ear clean and dry

## 2020-08-31 NOTE — ED Provider Notes (Signed)
[facility]    CSN: [id number] Arrival date & time: 08/31/20  5732      History   Chief Complaint Chief Complaint  Patient presents with  . Otalgia    Right ear since tuesday    HPI Shelia Diaz is a 39 y.o. female presenting today for evaluation of right ear pain.  Reports ear discomfort for approximately 5 to 6 days.  Initially treated with ibuprofen and Flonase for possible fluid on the ears, but pain has worsened as well as developed some drainage from her canal.  Has had some mild intermittent congestion.   HPI  Past Medical History:  Diagnosis Date  . Allergy-induced asthma   . Chicken pox   . History of anemia     Patient Active Problem List   Diagnosis Date Noted  . Acute viral sinusitis 06/20/2020  . Hidradenitis suppurativa 07/29/2015  . Allergy-induced asthma 07/29/2015    Past Surgical History:  Procedure Laterality Date  . LAPAROSCOPIC GASTRIC SLEEVE RESECTION  03/16/2016    OB History   No obstetric history on file.      Home Medications    Prior to Admission medications   Medication Sig Start Date End Date Taking? Authorizing Provider  Calcium 500 MG CHEW Chew 1 each 3 (three) times daily by mouth.   Yes [provider]  cetirizine (ZYRTEC) 10 MG tablet Take 10 mg daily as needed by mouth.   Yes [provider]  clindamycin (CLINDAGEL) 1 % gel Apply topically. 07/27/18  Yes [provider]  Iron Carbonyl-Vitamin C-FOS 30-10-25 MG CHEW Chew by mouth.   Yes [provider]  Multiple Vitamin (MULTIVITAMIN) capsule Take by mouth.   Yes [provider]  vitamin B-12 (CYANOCOBALAMIN) 1000 MCG tablet Take by mouth.   Yes [provider]  cefdinir (OMNICEF) 300 MG capsule Take 1 capsule (300 mg total) by mouth 2 (two) times daily for 10 days. 08/31/20 09/10/20  Bralyn Espino C, PA-C  ciprofloxacin-dexamethasone (CIPRODEX) OTIC suspension Place 4 drops into the right ear 2 (two)  times daily for 7 days. 08/31/20 09/07/20  Kalab Camps, Junius Creamer, PA-C    Family History Family History  Problem Relation Age of Onset  . Heart disease Mother   . Stroke Mother   . Hypertension Mother   . Heart disease Father   . Stroke Father   . Arthritis Paternal Grandfather     Social History Social History   Tobacco Use  . Smoking status: Never Smoker  . Smokeless tobacco: Never Used  Vaping Use  . Vaping Use: Never used  Substance Use Topics  . Alcohol use: No    Alcohol/week: 0.0 standard drinks  . Drug use: No     Allergies   Penicillins   Review of Systems Review of Systems  Constitutional: Negative for activity change, appetite change, chills, fatigue and fever.  HENT: Positive for ear pain. Negative for congestion, rhinorrhea, sinus pressure, sore throat and trouble swallowing.   Eyes: Negative for discharge and redness.  Respiratory: Negative for cough, chest tightness and shortness of breath.   Cardiovascular: Negative for chest pain.  Gastrointestinal: Negative for abdominal pain, diarrhea, nausea and vomiting.  Musculoskeletal: Negative for myalgias.  Skin: Negative for rash.  Neurological: Negative for dizziness, light-headedness and headaches.     Physical Exam Triage Vital Signs ED Triage Vitals  Enc Vitals Group     BP      Pulse      Resp  Temp      Temp src      SpO2      Weight      Height      Head Circumference      Peak Flow      Pain Score      Pain Loc      Pain Edu?      Excl. in GC?    No data found.  Updated Vital Signs BP (!) 172/88 (BP Location: Left Arm)   Pulse 80   Temp 98.1 F (36.7 C) (Oral)   Resp 18   LMP 08/13/2020 (Exact Date)   SpO2 98%   Visual Acuity Right Eye Distance:   Left Eye Distance:   Bilateral Distance:    Right Eye Near:   Left Eye Near:    Bilateral Near:     Physical Exam Vitals and nursing note reviewed.  Constitutional:      Appearance: [name] and  well-nourished.     Comments: No acute distress  HENT:     Head: Normocephalic and atraumatic.     Ears:     Comments: External tragus tender to palpation, right canal edematous erythematous and with pustular drainage present, cannot visualize TM due to swelling  Left TM and canal without abnormality    Nose: Nose normal.  Eyes:     Conjunctiva/sclera: Conjunctivae normal.  Cardiovascular:     Rate and Rhythm: Normal rate.  Pulmonary:     Effort: Pulmonary effort is normal. No respiratory distress.  Abdominal:     General: There is no distension.  Musculoskeletal:        General: Normal range of motion.     Cervical back: Neck supple.  Skin:    General: Skin is warm and dry.  Neurological:     Mental Status: She is alert and oriented to person, place, and time.  Psychiatric:        Mood and Affect: Mood and affect normal.      UC Treatments / Results  Labs (all labs ordered are listed, but only abnormal results are displayed) Labs Reviewed - No data to display  EKG   Radiology No results found.  Procedures Procedures (including critical care time)  Medications Ordered in UC Medications - No data to display  Initial Impression / Assessment and Plan / UC Course  I have reviewed the triage vital signs and the nursing notes.  Pertinent labs & imaging results that were available during my care of the patient were reviewed by me and considered in my medical decision making (see chart for details).     Right otitis externa, cannot rule out otitis media given visualization, treating with oral and topical antibiotics.  Continue anti-inflammatories for pain.  Monitor for resolution. Final Clinical Impressions(s) / UC Diagnoses   Final diagnoses:  Acute diffuse otitis externa of right ear     Discharge Instructions     Begin Omnicef twice daily for the next 10 days Ciprodex drops twice daily for the next week Ibuprofen and Tylenol for pain and swelling Keep ear  clean and dry    ED Prescriptions    Medication Sig Dispense Auth. Provider   cefdinir (OMNICEF) 300 MG capsule Take 1 capsule (300 mg total) by mouth 2 (two) times daily for 10 days. 20 capsule Lupita Rosales C, PA-C   ciprofloxacin-dexamethasone (CIPRODEX) OTIC suspension Place 4 drops into the right ear 2 (two) times daily for 7 days. 7.5 mL Talyia Allende,  Rada Zegers C, PA-C     PDMP not reviewed this encounter.   Lew Dawes, [place] 08/31/20 1219

## 2020-08-31 NOTE — ED Triage Notes (Signed)
Patient states she has had ear pain since Tuesday. On Thursday pcm advised her to take Flonase otc and tylenol or Advil and if symptoms don't improve then see her tomorrow. Pt states pain was severe last night. Pt is aox4 and ambulatory.

## 2020-09-01 ENCOUNTER — Other Ambulatory Visit (HOSPITAL_BASED_OUTPATIENT_CLINIC_OR_DEPARTMENT_OTHER): Payer: Self-pay | Admitting: Internal Medicine

## 2020-09-01 ENCOUNTER — Ambulatory Visit: Payer: BC Managed Care – PPO | Attending: Internal Medicine

## 2020-09-01 ENCOUNTER — Telehealth: Payer: Self-pay

## 2020-09-01 DIAGNOSIS — Z23 Encounter for immunization: Secondary | ICD-10-CM

## 2020-09-01 MED FILL — PFIZER-BIONTECH COVID-19 VA: 30 | 21 days supply | Qty: 0 | Fill #0

## 2020-09-01 NOTE — Telephone Encounter (Signed)
[facility] Night - Client TELEPHONE ADVICE RECORD AccessNurse Patient Name: Shelia Diaz Gender: Female DOB: 08-23-1981 Age: 39 Y 4 M 2 D Return Phone Number: [phone] (Primary) Address: City/State/Zip: [place] [address] Client [facility] Night - Client Client Site [facility] - Night Physician Nicki Reaper - NP Contact Type Call Who Is Calling Patient / Member / Family / Caregiver Call Type Triage / Clinical Relationship To Patient Self Return Phone Number [phone] (Primary) Chief Complaint Earache Reason for Call Symptomatic / Request for Health Information Initial Comment Caller states that she was seen Thursday for an ear infection and now she can't hear out of her right ear. The pain in her ear has gotten worse, brownish- red discharge Translation No Nurse Assessment Nurse: Jetty Peeks, RN, Lillia Abed Date/Time (Eastern Time): 08/31/2020 8:08:10 AM Confirm and document reason for call. If symptomatic, describe symptoms. ---Caller states that she was seen Thursday for an ear infection and now she can't hear out of her right ear. States she wasn't rx'd abx and told to take tylenol and flonase. States the pain in her ear has gotten worse and there is a brownish- red discharge. No fever. Does the patient have any new or worsening symptoms? ---Yes Will a triage be completed? ---Yes Related visit to physician within the last 2 weeks? ---Yes Does the PT have any chronic conditions? (i.e. diabetes, asthma, this includes High risk factors for pregnancy, etc.) ---No Is the patient pregnant or possibly pregnant? (Ask all females between the ages of 90-55) ---No Is this a behavioral health or substance abuse call? ---No Guidelines Guideline Title Affirmed Question Affirmed Notes Nurse Date/Time (Eastern Time) Hearing Loss or Change [1] Hearing loss in one or both ears AND [2] sudden onset AND [3]  present now [place], Solicitor 08/31/2020 8:10:49 AM Disp. Time Lamount Cohen Time) Disposition Final User 08/31/2020 8:13:11 AM See HCP within 4 Hours (or PCP triage) Yes [place], RN, Lillia Abed PLEASE NOTE: All timestamps contained within this report are represented as [place] Standard Time. CONFIDENTIALTY NOTICE: This fax transmission is intended only for the addressee. It contains information that is legally privileged, confidential or otherwise protected from use or disclosure. If you are not the intended recipient, you are strictly prohibited from reviewing, disclosing, copying using or disseminating any of this information or taking any action in reliance on or regarding this information. If you have received this fax in error, please notify us immediately by telephone so that we can arrange for its return to [place]. Phone: [phone], Toll-Free: [phone], Fax: [phone] Page: 2 of 2 Call Id: [id number] Caller Disagree/Comply Comply Caller Understands Yes PreDisposition InappropriateToAsk Care Advice Given Per Guideline SEE HCP (OR PCP TRIAGE) WITHIN 4 HOURS: * IF OFFICE WILL BE CLOSED AND NO PCP (PRIMARY CARE PROVIDER) SECOND-LEVEL TRIAGE: You need to be seen within the next 3 or 4 hours. A nearby Urgent Care Center [facility]) is often a good source of care. Another choice is to go to the ED. Go sooner if you become worse. CALL BACK IF: * You become worse CARE ADVICE given per Hearing Loss (Adult) guideline. Referrals GO TO FACILITY UNDECIDED

## 2020-09-01 NOTE — Telephone Encounter (Signed)
Per chart review tab pt seen 08/31/20 at [facility] [place].

## 2020-09-01 NOTE — Progress Notes (Signed)
   Covid-19 Vaccination Clinic  Name:  Shelia Diaz    MRN: [id number] DOB: 11-21-1980  09/01/2020  [name] was observed post Covid-19 immunization for 15 minutes without incident. She was provided with Vaccine Information Sheet and instruction to access the V-Safe system.   [name] was instructed to call 911 with any severe reactions post vaccine: Marland Kitchen Difficulty breathing  . Swelling of face and throat  . A fast heartbeat  . A bad rash all over body  . Dizziness and weakness   Immunizations Administered    Name Date Dose VIS Date Route   Pfizer COVID-19 Vaccine 09/01/2020  9:25 AM 0.3 mL 06/25/2020 Intramuscular   Manufacturer: [employer], [employer]   Lot: [id number]   NDC: [id number]

## 2020-09-01 NOTE — Telephone Encounter (Signed)
UC note reviewed.

## 2020-10-23 ENCOUNTER — Telehealth: Payer: BC Managed Care – PPO | Admitting: Physician Assistant

## 2020-10-23 DIAGNOSIS — B9789 Other viral agents as the cause of diseases classified elsewhere: Secondary | ICD-10-CM

## 2020-10-23 DIAGNOSIS — J019 Acute sinusitis, unspecified: Secondary | ICD-10-CM

## 2020-10-23 MED ORDER — AZELASTINE HCL 0.1 % NA SOLN: 1.0000 | Freq: Two times a day (BID) | NASAL | 0 refills | Status: DC

## 2020-10-23 NOTE — Progress Notes (Signed)

## 2020-10-28 ENCOUNTER — Encounter: Payer: BC Managed Care – PPO | Admitting: Internal Medicine

## 2020-10-29 ENCOUNTER — Encounter: Payer: Self-pay | Admitting: Internal Medicine

## 2020-10-31 ENCOUNTER — Encounter: Payer: BC Managed Care – PPO | Admitting: Internal Medicine

## 2020-12-29 ENCOUNTER — Other Ambulatory Visit: Payer: Self-pay

## 2020-12-29 ENCOUNTER — Encounter: Payer: BC Managed Care – PPO | Admitting: Internal Medicine

## 2020-12-29 ENCOUNTER — Other Ambulatory Visit: Payer: BC Managed Care – PPO

## 2020-12-29 DIAGNOSIS — Z0283 Encounter for blood-alcohol and blood-drug test: Secondary | ICD-10-CM

## 2020-12-29 NOTE — Progress Notes (Signed)
Presents to [facility] [employer] [facility] for on-site pre-employment urine drug screen.  [facility] Acct # [id number] [facility] Specimen # [id number]  Urine Drug Screen Results = Negative   AMD

## 2021-03-31 ENCOUNTER — Other Ambulatory Visit: Payer: Self-pay

## 2021-03-31 ENCOUNTER — Other Ambulatory Visit: Payer: Self-pay | Admitting: Internal Medicine

## 2021-03-31 ENCOUNTER — Encounter: Payer: Self-pay | Admitting: Internal Medicine

## 2021-03-31 ENCOUNTER — Ambulatory Visit: Payer: BC Managed Care – PPO | Admitting: Internal Medicine

## 2021-03-31 VITALS — BP 130/72 | HR 61 | Temp 98.5°F | Resp 18 | Ht 64.0 in | Wt 304.2 lb

## 2021-03-31 DIAGNOSIS — Z0000 Encounter for general adult medical examination without abnormal findings: Secondary | ICD-10-CM | POA: Insufficient documentation

## 2021-03-31 DIAGNOSIS — J4520 Mild intermittent asthma, uncomplicated: Secondary | ICD-10-CM | POA: Diagnosis not present

## 2021-03-31 DIAGNOSIS — L732 Hidradenitis suppurativa: Secondary | ICD-10-CM

## 2021-03-31 MED ORDER — ALBUTEROL SULFATE HFA 108 (90 BASE) MCG/ACT IN AERS: 2.0000 | INHALATION_SPRAY | Freq: Four times a day (QID) | RESPIRATORY_TRACT | 0 refills | Status: DC | PRN

## 2021-03-31 NOTE — Assessment & Plan Note (Signed)
Seeing dermatology within a month or so. Takes topical with intermittent antibiotics for flares and will talk to them about most appropriate treatment.

## 2021-03-31 NOTE — Progress Notes (Signed)
   Subjective:   Patient ID: Shelia Diaz, female    DOB: 11-22-1980, 40 y.o.   MRN: [id number]  HPI The patient is a transfer of care coming in for physical.   PMH, [facility], social history reviewed and updated  Review of Systems  Constitutional: Negative.   HENT: Negative.    Eyes: Negative.   Respiratory:  Negative for cough, chest tightness and shortness of breath.   Cardiovascular:  Negative for chest pain, palpitations and leg swelling.  Gastrointestinal:  Negative for abdominal distention, abdominal pain, constipation, diarrhea, nausea and vomiting.  Musculoskeletal:  Positive for arthralgias.  Skin: Negative.   Neurological: Negative.   Psychiatric/Behavioral: Negative.     Objective:  Physical Exam Constitutional:      Appearance: [name]. She is obese.  HENT:     Head: Normocephalic and atraumatic.  Cardiovascular:     Rate and Rhythm: Normal rate and regular rhythm.  Pulmonary:     Effort: Pulmonary effort is normal. No respiratory distress.     Breath sounds: Normal breath sounds. No wheezing or rales.  Abdominal:     General: Bowel sounds are normal. There is no distension.     Palpations: Abdomen is soft.     Tenderness: There is no abdominal tenderness. There is no rebound.  Musculoskeletal:     Cervical back: Normal range of motion.  Skin:    General: Skin is warm and dry.  Neurological:     Mental Status: She is alert and oriented to person, place, and time.     Coordination: Coordination normal.    Vitals:   03/31/21 0900  BP: 130/72  Pulse: 61  Resp: 18  Temp: 98.5 F (36.9 C)  TempSrc: Oral  SpO2: 99%  Weight: (!) 304 lb 3.2 oz (138 kg)  Height: 5\' 4"  (1.626 m)    This visit occurred during the SARS-CoV-2 public health emergency.  Safety protocols were in place, including screening questions prior to the visit, additional usage of staff PPE, and extensive cleaning of exam room while observing appropriate contact time as  indicated for disinfecting solutions.   Assessment & Plan:

## 2021-03-31 NOTE — Assessment & Plan Note (Signed)
No current problems. Does not have inhaler so rx for albuterol prn. She takes zyrtec for allergies.

## 2021-03-31 NOTE — Assessment & Plan Note (Signed)
Flu shot yearly. Covid-19 up to date. Tetanus up to date. Pap smear due soon. Counseled about sun safety and mole surveillance. Counseled about the dangers of distracted driving. Given 10 year screening recommendations.

## 2021-03-31 NOTE — Assessment & Plan Note (Signed)
BMI 52 and working with weight loss clinic to help and taking wegovy. Prior weight loss procedure which helped but not as much as she was hoping. Recent loss of several close family members which has caused setbacks.

## 2021-06-30 ENCOUNTER — Telehealth (INDEPENDENT_AMBULATORY_CARE_PROVIDER_SITE_OTHER): Payer: BC Managed Care – PPO | Admitting: Internal Medicine

## 2021-06-30 ENCOUNTER — Encounter: Payer: Self-pay | Admitting: Internal Medicine

## 2021-06-30 ENCOUNTER — Other Ambulatory Visit: Payer: Self-pay | Admitting: Internal Medicine

## 2021-06-30 DIAGNOSIS — J4520 Mild intermittent asthma, uncomplicated: Secondary | ICD-10-CM

## 2021-06-30 MED ORDER — MONTELUKAST SODIUM 10 MG PO TABS: 10.0000 mg | ORAL_TABLET | Freq: Every day | ORAL | 0 refills | Status: DC

## 2021-06-30 MED ORDER — PROMETHAZINE-DM 6.25-15 MG/5ML PO SYRP: 5.0000 mL | ORAL_SOLUTION | Freq: Four times a day (QID) | ORAL | 0 refills | Status: DC | PRN

## 2021-06-30 NOTE — Progress Notes (Signed)
Virtual Visit via Video Note  I connected with Shelia Diaz on 06/30/21 at  2:40 PM EDT by a video enabled telemedicine application and verified that I am speaking with the correct person using two identifiers.  The patient and the provider were at separate locations throughout the entire encounter. Patient location: home, Provider location: work   I discussed the limitations of evaluation and management by telemedicine and the availability of in person appointments. The patient expressed understanding and agreed to proceed. The patient and the provider were the only parties present for the visit unless noted in HPI below.  History of Present Illness: The patient is a 40 y.o. female with visit for sinuses. Started Sunday night/Monday a week ago. Has taken mucinex. Covid-19 negative home test  Observations/Objective: Appearance: normal, breathing appears normal, some coughing during visit, no respiratory distress  Assessment and Plan: See problem oriented charting  Follow Up Instructions: rx promethazine/dm and singulair  I discussed the assessment and treatment plan with the patient. The patient was provided an opportunity to ask questions and all were answered. The patient agreed with the plan and demonstrated an understanding of the instructions.   The patient was advised to call back or seek an in-person evaluation if the symptoms worsen or if the condition fails to improve as anticipated.  Myrlene Broker, MD

## 2021-06-30 NOTE — Assessment & Plan Note (Signed)
No flare today and informed it is okay to take the albuterol inhaler for tightness. Rx promethazine/dm and singulair to help with lingering cough post viral likely. No antibiotics are indicated today. Keep taking zyrtec otc.

## 2021-07-17 ENCOUNTER — Ambulatory Visit: Payer: BC Managed Care – PPO

## 2021-07-18 ENCOUNTER — Other Ambulatory Visit: Payer: Self-pay

## 2021-07-18 ENCOUNTER — Ambulatory Visit: Payer: BC Managed Care – PPO

## 2021-07-18 ENCOUNTER — Ambulatory Visit (HOSPITAL_COMMUNITY)
Admission: EM | Admit: 2021-07-18 | Discharge: 2021-07-18 | Disposition: A | Discharge status: Home / Self Care | Payer: BC Managed Care – PPO | Attending: Emergency Medicine | Admitting: Emergency Medicine

## 2021-07-18 ENCOUNTER — Encounter (HOSPITAL_COMMUNITY): Payer: Self-pay | Admitting: *Deleted

## 2021-07-18 ENCOUNTER — Ambulatory Visit (INDEPENDENT_AMBULATORY_CARE_PROVIDER_SITE_OTHER): Payer: BC Managed Care – PPO

## 2021-07-18 DIAGNOSIS — M62838 Other muscle spasm: Secondary | ICD-10-CM | POA: Diagnosis not present

## 2021-07-18 DIAGNOSIS — M5441 Lumbago with sciatica, right side: Secondary | ICD-10-CM

## 2021-07-18 DIAGNOSIS — M62830 Muscle spasm of back: Secondary | ICD-10-CM

## 2021-07-18 MED ORDER — ACETAMINOPHEN 500 MG PO TABS: 1000.0000 mg | ORAL_TABLET | Freq: Three times a day (TID) | ORAL | 0 refills | Status: AC

## 2021-07-18 MED ORDER — METHYLPREDNISOLONE 4 MG PO TBPK: ORAL_TABLET | ORAL | 0 refills | Status: DC

## 2021-07-18 MED ORDER — METHYLPREDNISOLONE SODIUM SUCC 125 MG IJ SOLR
INTRAMUSCULAR | Status: AC
  Filled 2021-07-18: qty 2

## 2021-07-18 MED ORDER — METHYLPREDNISOLONE SODIUM SUCC 125 MG IJ SOLR
125.0000 mg | Freq: Once | INTRAMUSCULAR | Status: AC
  Administered 2021-07-18: 125 mg via INTRAMUSCULAR

## 2021-07-18 MED ORDER — BACLOFEN 10 MG PO TABS: 10.0000 mg | ORAL_TABLET | Freq: Every day | ORAL | 0 refills | Status: DC

## 2021-07-18 MED ORDER — KETOROLAC TROMETHAMINE 60 MG/2ML IM SOLN
60.0000 mg | Freq: Once | INTRAMUSCULAR | Status: AC
  Administered 2021-07-18: 60 mg via INTRAMUSCULAR

## 2021-07-18 MED ORDER — KETOROLAC TROMETHAMINE 60 MG/2ML IM SOLN
INTRAMUSCULAR | Status: AC
  Filled 2021-07-18: qty 2

## 2021-07-18 NOTE — Discharge Instructions (Addendum)
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  Your x-ray of your lumbar spine today shows some joint space narrowing between L5 and S1 which is concerning for possible degenerative disc disease.  An MRI would be required for definitive diagnosis.    You were provided with an injection of ketorolac and Solu-Medrol today in the office for rapid anti-inflammatory treatment.  We also provided you with a prescription for Medrol Dosepak but I would like you to take daily for the next 6 days, please take 1 row of tablets daily.  This should reduce the inflammation between those 2 bones and if the disc is also inflamed this will help with this as well.    For nighttime, I provided you with a prescription for a muscle relaxer.  When you have a lot of pain, the muscles around the area that is in pain can often spasm which often begets more pain.  Muscle relaxer will help address this and hopefully keep it well controlled.  Please take 1 tablet 3 to 4 hours before you plan to go to bed.    Finally I have also provided you with a prescription for Tylenol 1000 mg 3 times daily, please take it regularly for the next 4 to 5 days, this can be taken with all the other medications I prescribed for you safely.  You can certainly continue taking Aleve but please do not do so until day 3 of the Medrol Dosepak.    I recommend that you follow-up with your primary care provider in the next 3 to 5 days to discuss your progress and adjust your medications if needed.the results of your x-ray will be made available to your primary care provider.  As we discussed, if you would like to have a second ketorolac injection in 2 to 3 days from now, and you have not been able to get in with your primary, please come to the [facility] urgent care or an located and I will be happy to provide it for you.  If you begin to experience loss of motor control of your lower extremities, become incontinent of stool or  urine, sensation in lower extremities, or worsening pain, please report to the emergency room for emergency evaluation.  For the time being, please avoid attempting to stretch and "loosen up" your lower back, you are in full spasm and this will actually only made things worse. I recommend rest and frequent repositioning throughout the day.  Also recommend applying ice pack to your lower back 4 times daily for 23 minutes at a time.  Once you are feeling completely better, I recommend that you meet with a physical therapist preventatively to tease out the underlying cause of this incident as it is very likely to return sometime in the near future if the underlying cause is not addressed.  I have provided you with the names of 3 physical therapist in the area and you can certainly ask your primary care provider for referral as well.  Thank you for visiting urgent care today, I hope you feel better soon.

## 2021-07-18 NOTE — ED Triage Notes (Signed)
PT reports back pain started onfriday. Pain now goes down the rt leg

## 2021-07-18 NOTE — ED Provider Notes (Signed)
[facility]    CSN: [id number] Arrival date & time:      History   Chief Complaint Chief Complaint  Patient presents with   Back Pain   HPI Shelia Diaz is a 40 y.o. female. 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  Patient states that she has been doing this for 18 years however earlier this week when she bent over one of her students desks to pick something up, she had a painful catch in her back which she states took her breath away.  Patient states that since then she has tried over-the-counter remedies for this including Tylenol and Aleve, states the Aleve helps some but Tylenol helps not at all.  Patient states her symptoms are usually manageable in the daytime however at nighttime she is having a hard time falling asleep and if she even is able to fall asleep when she moves the pain is excruciating wakes her up.  She try to get an appointment with her primary care provider but was unable to do so.  Patient states her pain peaked 2 days ago and has not improved or worsened since then.  Patient states she has never had this in the past.  Patient states she has pain rating down her right leg that is dull, gnawing, aching.  Patient states that she likes to go to Zumba classes and walks for exercise regularly.  The history is provided by the patient.   Past Medical History:  Diagnosis Date   Allergy-induced asthma    Chicken pox    History of anemia    Patient Active Problem List   Diagnosis Date Noted   Routine general medical examination at a health care facility 03/31/2021   Morbid obesity (HCC) 07/29/2015   Hidradenitis suppurativa 07/29/2015   Allergy-induced asthma 07/29/2015   Past Surgical History:  Procedure Laterality Date   LAPAROSCOPIC GASTRIC SLEEVE RESECTION  03/16/2016   OB History   No obstetric history on file.    Home Medications    Prior to Admission medications    Medication Sig Start Date End Date Taking? Authorizing Provider  acetaminophen (TYLENOL) 500 MG tablet Take 2 tablets (1,000 mg total) by mouth every 8 (eight) hours for 14 days. 07/18/21 08/01/21 Yes Theadora Rama Scales, PA-C  baclofen (LIORESAL) 10 MG tablet Take 1 tablet (10 mg total) by mouth at bedtime for 7 days. 07/18/21 07/25/21 Yes Theadora Rama Scales, PA-C  methylPREDNISolone (MEDROL DOSEPAK) 4 MG TBPK tablet Take 24 mg on day 1, 20 mg on day 2, 16 mg on day 3, 12 mg on day 4, 8 mg on day 5, 4 mg on day 6. 07/18/21  Yes Theadora Rama Scales, PA-C  albuterol (VENTOLIN HFA) 108 (90 Base) MCG/ACT inhaler Inhale 2 puffs into the lungs every 6 (six) hours as needed for wheezing or shortness of breath. 03/31/21   Myrlene Broker, MD  Calcium 500 MG CHEW Chew 1 each 3 (three) times daily by mouth.    [provider]  cetirizine (ZYRTEC) 10 MG tablet Take 10 mg daily as needed by mouth.    [provider]  clindamycin (CLINDAGEL) 1 % gel Apply topically. 07/27/18   [provider]  Iron Carbonyl-Vitamin C-FOS 30-10-25 MG CHEW Chew by mouth.    [provider]  montelukast (SINGULAIR) 10 MG tablet Take 1 tablet (10 mg total) by mouth at bedtime. 06/30/21   Hillard Danker  A, MD  Multiple Vitamin (MULTIVITAMIN) capsule Take by mouth.    [provider]  promethazine-dextromethorphan (PROMETHAZINE-DM) 6.25-15 MG/5ML syrup Take 5 mLs by mouth 4 (four) times daily as needed for cough. 06/30/21   Myrlene Broker, MD  vitamin B-12 (CYANOCOBALAMIN) 1000 MCG tablet Take by mouth.    [provider]  WEGOVY 2.4 MG/0.75ML SOAJ Inject 2.4 mg into the skin once a week. 03/12/21   [provider]   Family History Family History  Problem Relation Age of Onset   Heart disease Mother    Stroke Mother    Hypertension Mother    Heart disease Father    Stroke Father    Arthritis Paternal Grandfather    Social History Social  History   Tobacco Use   Smoking status: Never   Smokeless tobacco: Never  Vaping Use   Vaping Use: Never used  Substance Use Topics   Alcohol use: No    Alcohol/week: 0.0 standard drinks   Drug use: No   Allergies   Penicillins  Review of Systems Review of Systems Pertinent findings noted in history of present illness.   Physical Exam Triage Vital Signs ED Triage Vitals  Enc Vitals Group     BP 07/03/21 0827 (!) 147/82     Pulse Rate 07/03/21 0827 72     Resp 07/03/21 0827 18     Temp 07/03/21 0827 98.3 F (36.8 C)     Temp Source 07/03/21 0827 Oral     SpO2 07/03/21 0827 98 %     Weight --      Height --      Head Circumference --      Peak Flow --      Pain Score 07/03/21 0826 5     Pain Loc --      Pain Edu? --      Excl. in GC? --    No data found.  Updated Vital Signs BP (!) 147/81   Pulse 73   Temp 98.7 F (37.1 C)   Resp 20   LMP 06/17/2021   SpO2 100%   Visual Acuity Right Eye Distance:   Left Eye Distance:   Bilateral Distance:    Right Eye Near:   Left Eye Near:    Bilateral Near:     Physical Exam Vitals and nursing note reviewed.  Constitutional:      General: She is not in acute distress.    Appearance: Normal appearance. She is not ill-appearing.  HENT:     Head: Normocephalic and atraumatic.  Eyes:     General: Lids are normal.        Right eye: No discharge.        Left eye: No discharge.     Extraocular Movements: Extraocular movements intact.     Conjunctiva/sclera: Conjunctivae normal.     Right eye: Right conjunctiva is not injected.     Left eye: Left conjunctiva is not injected.  Neck:     Trachea: Trachea and phonation normal.  Cardiovascular:     Rate and Rhythm: Normal rate and regular rhythm.     Pulses: Normal pulses.     Heart sounds: Normal heart sounds. No murmur heard.   No friction rub. No gallop.  Pulmonary:     Effort: Pulmonary effort is normal. No accessory muscle usage, prolonged expiration or  respiratory distress.     Breath sounds: Normal breath sounds. No stridor, decreased air movement or transmitted upper airway  sounds. No decreased breath sounds, wheezing, rhonchi or rales.  Chest:     Chest wall: No tenderness.  Musculoskeletal:        General: Normal range of motion.     Cervical back: Normal range of motion and neck supple. Normal range of motion.     Comments: Patient is diffusely tender at the right SI joint.  Patient demonstrates good range of motion physical exam today but has pain doing so.  Patient was unable to remain in any 1 position for more than a few minutes during our visit today.  Lymphadenopathy:     Cervical: No cervical adenopathy.  Skin:    General: Skin is warm and dry.     Findings: No erythema or rash.  Neurological:     General: No focal deficit present.     Mental Status: She is alert and oriented to person, place, and time.  Psychiatric:        Mood and Affect: Mood normal.        Behavior: Behavior normal.   UC Treatments / Results  Labs (all labs ordered are listed, but only abnormal results are displayed)  Labs Reviewed - No data to display  EKG  Radiology DG Lumbar Spine Complete  Result Date: 07/18/2021 CLINICAL DATA:  Back pain starting Friday radiating to right leg. EXAM: LUMBAR SPINE - COMPLETE 4+ VIEW COMPARISON:  January 21 FINDINGS: There is no evidence of lumbar spine fracture. Alignment is normal. Minimal narrow L5-S1 interspace is identified. IMPRESSION: No acute fracture or dislocation identified. Electronically Signed   By: Sherian Rein M.D.   On: 07/18/2021 13:20    Procedures Procedures (including critical care time)  Medications Ordered in UC Medications  ketorolac (TORADOL) injection 60 mg (has no administration in time range)  methylPREDNISolone sodium succinate (SOLU-MEDROL) 125 mg/2 mL injection 125 mg (has no administration in time range)    Initial Impression / Assessment and Plan / UC Course  I have  reviewed the triage vital signs and the nursing notes.  Pertinent labs & imaging results that were available during my care of the patient were reviewed by me and considered in my medical decision making (see chart for details).      Right sciatica secondary to right-sided lumbago.  Inflammatory treatment a muscle relaxer for nighttime along with recommendation to take Tylenol 1000 mg 3 times daily for the neck several days.  Patient advised to follow-up with primary care and if unable to do so she can certainly return to urgent care for repeat evaluation in 3 to 5 days if needed.  Patient also advised that physical therapy is recommended and to avoid aggressively attempting to stretch or use the spasm in her lower back at home.  Ice 3 times a day to lower back.  Change positions frequently.  Report to the emergency room if you have any numbness, loss of control of right lower leg, loss of control of bowels or bladder.  Final Clinical Impressions(s) / UC Diagnoses   Final diagnoses:  Muscle spasm  Acute right-sided low back pain with right-sided sciatica  Muscle spasm of back     Discharge Instructions      You have right-sided sciatica secondary to right SI joint dysfunction.  Your x-ray of your lumbar spine today shows some joint space narrowing between L5 and S1 which is concerning for possible degenerative disc disease.  An MRI would be required for definitive diagnosis.    You were provided with an  injection of ketorolac and Solu-Medrol today in the office for rapid anti-inflammatory treatment.  We also provided you with a prescription for Medrol Dosepak but I would like you to take daily for the next 6 days, please take 1 row of tablets daily.  This should reduce the inflammation between those 2 bones and if the disc is also inflamed this will help with this as well.    For nighttime, I provided you with a prescription for a muscle relaxer.  When you have a lot of pain, the muscles  around the area that is in pain can often spasm which often begets more pain.  Muscle relaxer will help address this and hopefully keep it well controlled.  Please take 1 tablet 3 to 4 hours before you plan to go to bed.    Finally I have also provided you with a prescription for Tylenol 1000 mg 3 times daily, please take it regularly for the next 4 to 5 days, this can be taken with all the other medications I prescribed for you safely.  You can certainly continue taking Aleve but please do not do so until day 3 of the Medrol Dosepak.    I recommend that you follow-up with your primary care provider in the next 3 to 5 days to discuss your progress and adjust your medications if needed.the results of your x-ray will be made available to your primary care provider.  As we discussed, if you would like to have a second ketorolac injection in 2 to 3 days from now, and you have not been able to get in with your primary, please come to the [facility] urgent care or an located and I will be happy to provide it for you.  If you begin to experience loss of motor control of your lower extremities, become incontinent of stool or urine, sensation in lower extremities, or worsening pain, please report to the emergency room for emergency evaluation.  For the time being, please avoid attempting to stretch and "loosen up" your lower back, you are in full spasm and this will actually only made things worse. I recommend rest and frequent repositioning throughout the day.  Also recommend applying ice pack to your lower back 4 times daily for 23 minutes at a time.  Once you are feeling completely better, I recommend that you meet with a physical therapist preventatively to tease out the underlying cause of this incident as it is very likely to return sometime in the near future if the underlying cause is not addressed.  I have provided you with the names of 3 physical therapist in the area and you can certainly ask your  primary care provider for referral as well.  Thank you for visiting urgent care today, I hope you feel better soon.     ED Prescriptions     Medication Sig Dispense Auth. Provider   methylPREDNISolone (MEDROL DOSEPAK) 4 MG TBPK tablet Take 24 mg on day 1, 20 mg on day 2, 16 mg on day 3, 12 mg on day 4, 8 mg on day 5, 4 mg on day 6. 21 tablet Theadora Rama Scales, PA-C   baclofen (LIORESAL) 10 MG tablet Take 1 tablet (10 mg total) by mouth at bedtime for 7 days. 7 tablet Theadora Rama Scales, PA-C   acetaminophen (TYLENOL) 500 MG tablet Take 2 tablets (1,000 mg total) by mouth every 8 (eight) hours for 14 days. 84 tablet Theadora Rama Scales, [place]  PDMP not reviewed this encounter.  Disposition Upon Discharge:  Patient presented with an acute illness with associated systemic symptoms and significant discomfort requiring urgent management. In my opinion, this is a condition that a prudent lay person (someone who possesses an average knowledge of health and medicine) may potentially expect to result in complications if not addressed urgently such as respiratory distress, impairment of bodily function or dysfunction of bodily organs.   Routine symptom specific, illness specific and/or disease specific instructions were discussed with the patient and/or caregiver at length.   As such, the patient has been evaluated and assessed, work-up was performed and treatment was provided in alignment with urgent care protocols and evidence based medicine.  Patient/parent/caregiver has been advised that the patient may require follow up for further testing and treatment if the symptoms continue in spite of treatment, as clinically indicated and appropriate.  Patient/parent/caregiver has been advised to return to the [facility] or PCP in 3-5 days if no better; to PCP or the Emergency Department if new signs and symptoms develop, or if the current signs or symptoms continue to change or worsen for further  workup, evaluation and treatment as clinically indicated and appropriate  The patient will follow up with their current PCP if and as advised. If the patient does not currently have a PCP we will assist them in obtaining one.   The patient may need specialty follow up if the symptoms continue, in spite of conservative treatment and management, for further workup, evaluation, consultation and treatment as clinically indicated and appropriate.  Patient/parent/caregiver verbalized understanding and agreement of plan as discussed.  All questions were addressed during visit.  Please see discharge instructions below for further details of plan.  Condition: stable for discharge home Home: take medications as prescribed; routine discharge instructions as discussed; follow up as advised.    Theadora Rama Scales, PA-C 07/18/21 1332

## 2021-07-19 ENCOUNTER — Ambulatory Visit (HOSPITAL_COMMUNITY): Payer: BC Managed Care – PPO

## 2021-07-24 ENCOUNTER — Other Ambulatory Visit: Payer: Self-pay | Admitting: Internal Medicine

## 2021-07-24 ENCOUNTER — Other Ambulatory Visit: Payer: Self-pay

## 2021-07-24 ENCOUNTER — Ambulatory Visit (INDEPENDENT_AMBULATORY_CARE_PROVIDER_SITE_OTHER): Payer: BC Managed Care – PPO | Admitting: Internal Medicine

## 2021-07-24 ENCOUNTER — Encounter: Payer: Self-pay | Admitting: Internal Medicine

## 2021-07-24 DIAGNOSIS — M5442 Lumbago with sciatica, left side: Secondary | ICD-10-CM | POA: Diagnosis not present

## 2021-07-24 DIAGNOSIS — M5441 Lumbago with sciatica, right side: Secondary | ICD-10-CM

## 2021-07-24 MED ORDER — MELOXICAM 15 MG PO TABS: 15.0000 mg | ORAL_TABLET | Freq: Every day | ORAL | 0 refills | Status: DC

## 2021-07-24 MED ORDER — BACLOFEN 10 MG PO TABS
10.0000 mg | ORAL_TABLET | Freq: Every day | ORAL | 0 refills | Status: DC
Start: 2021-07-24 — End: 2022-06-09

## 2021-07-24 NOTE — Patient Instructions (Addendum)
We have sent in meloxicam to use daily for 1-2 weeks.   We have refilled the muscle relaxer.

## 2021-07-24 NOTE — Progress Notes (Signed)
   Subjective:   Patient ID: Shelia Diaz, female    DOB: July 10, 1981, 40 y.o.   MRN: [id number]  HPI The patient is a 40 YO female coming in for follow up urgent care back pain  Review of Systems  Constitutional:  Positive for activity change. Negative for appetite change, chills, fatigue, fever and unexpected weight change.  Respiratory: Negative.    Cardiovascular: Negative.   Gastrointestinal: Negative.   Musculoskeletal:  Positive for arthralgias, back pain and myalgias. Negative for gait problem and joint swelling.  Skin: Negative.   Neurological: Negative.    Objective:  Physical Exam Constitutional:      Appearance: [name].  HENT:     Head: Normocephalic and atraumatic.  Cardiovascular:     Rate and Rhythm: Normal rate and regular rhythm.  Pulmonary:     Effort: Pulmonary effort is normal. No respiratory distress.     Breath sounds: Normal breath sounds. No wheezing or rales.  Abdominal:     General: Bowel sounds are normal. There is no distension.     Palpations: Abdomen is soft.     Tenderness: There is no abdominal tenderness. There is no rebound.  Musculoskeletal:        General: Tenderness present.     Cervical back: Normal range of motion.  Skin:    General: Skin is warm and dry.  Neurological:     Mental Status: She is alert and oriented to person, place, and time.     Coordination: Coordination normal.    Vitals:   07/24/21 1538  BP: 130/68  Pulse: 68  Resp: 18  SpO2: 100%  Weight: (!) 301 lb 6.4 oz (136.7 kg)  Height: 5\' 4"  (1.626 m)    This visit occurred during the SARS-CoV-2 public health emergency.  Safety protocols were in place, including screening questions prior to the visit, additional usage of staff PPE, and extensive cleaning of exam room while observing appropriate contact time as indicated for disinfecting solutions.   Assessment & Plan:

## 2021-07-25 DIAGNOSIS — M5441 Lumbago with sciatica, right side: Secondary | ICD-10-CM | POA: Insufficient documentation

## 2021-07-25 NOTE — Assessment & Plan Note (Signed)
Rx meloxicam for 1-2 weeks, refill baclofen for night time prn. She is working on weight loss and given exercises to help avoid re-injury.

## 2021-08-24 ENCOUNTER — Ambulatory Visit: Payer: BC Managed Care – PPO | Admitting: Internal Medicine

## 2021-08-24 ENCOUNTER — Encounter: Payer: Self-pay | Admitting: Internal Medicine

## 2021-08-24 ENCOUNTER — Other Ambulatory Visit: Payer: Self-pay

## 2021-08-24 DIAGNOSIS — N611 Abscess of the breast and nipple: Secondary | ICD-10-CM | POA: Diagnosis not present

## 2021-08-24 DIAGNOSIS — L02411 Cutaneous abscess of right axilla: Secondary | ICD-10-CM | POA: Insufficient documentation

## 2021-08-24 NOTE — Progress Notes (Signed)
° °  Subjective:   Patient ID: Shelia Diaz, female    DOB: 1981/02/21, 40 y.o.   MRN: [id number]  HPI The patient is a 40 YO female coming in for abscess under right breast. Partially drained and then resealed with moderate amount of fluid remained.   Review of Systems  Constitutional: Negative.   HENT: Negative.    Eyes: Negative.   Respiratory:  Negative for cough, chest tightness and shortness of breath.   Cardiovascular:  Negative for chest pain, palpitations and leg swelling.  Gastrointestinal:  Negative for abdominal distention, abdominal pain, constipation, diarrhea, nausea and vomiting.  Musculoskeletal: Negative.   Skin:  Positive for wound.  Neurological: Negative.   Psychiatric/Behavioral: Negative.     Objective:  Physical Exam Constitutional:      Appearance: [name]. She is obese.  HENT:     Head: Normocephalic and atraumatic.  Cardiovascular:     Rate and Rhythm: Normal rate and regular rhythm.     Comments: Abscess under right breast drained see separate procedure note Pulmonary:     Effort: Pulmonary effort is normal. No respiratory distress.     Breath sounds: Normal breath sounds. No wheezing or rales.  Abdominal:     General: Bowel sounds are normal. There is no distension.     Palpations: Abdomen is soft.     Tenderness: There is no abdominal tenderness. There is no rebound.  Musculoskeletal:     Cervical back: Normal range of motion.  Skin:    General: Skin is warm and dry.  Neurological:     Mental Status: She is alert and oriented to person, place, and time.     Coordination: Coordination normal.    Vitals:   08/24/21 0822  BP: 128/84  Pulse: 68  Resp: 18  SpO2: 98%  Weight: (!) 303 lb 6.4 oz (137.6 kg)  Height: 5\' 4"  (1.626 m)    This visit occurred during the SARS-CoV-2 public health emergency.  Safety protocols were in place, including screening questions prior to the visit, additional usage of staff PPE, and extensive  cleaning of exam room while observing appropriate contact time as indicated for disinfecting solutions.   Assessment & Plan:

## 2021-08-24 NOTE — Progress Notes (Signed)
Location: right breast  Indication: pain, swelling  Informed Consent: Discussed risks (permanent scarring, light or dark discoloration, infection, pain, bleeding, bruising, redness, damage to adjacent structures, and recurrence of the lesion) and benefits of the procedure, as well as the alternatives.  Informed consent was obtained.  Preparation: The area was prepped with alcohol.  Anesthesia: Lidocaine 1% without epinephrine  Procedure Details: An incision was made overlying the lesion. The lesion drained pus and blood.  5-10 cc pus and serosanguinous fluid drained   Antibiotic ointment and a sterile pressure dressing were applied. The patient tolerated procedure well.  Total number of lesions drained: 1  Plan: The patient was instructed on post-op care. Recommend OTC analgesia as needed for pain.

## 2021-08-24 NOTE — Assessment & Plan Note (Signed)
I and D performed after consent obtained right breast. Bandaged and aftercare done. No complications to procedure. No antibiotics are indicated but advised to use topical clindamycin gel given to her from dermatology to prevent recurrence.

## 2021-09-09 ENCOUNTER — Ambulatory Visit: Payer: BC Managed Care – PPO | Attending: Internal Medicine

## 2021-09-09 ENCOUNTER — Other Ambulatory Visit (HOSPITAL_BASED_OUTPATIENT_CLINIC_OR_DEPARTMENT_OTHER): Payer: Self-pay

## 2021-09-09 ENCOUNTER — Other Ambulatory Visit: Payer: Self-pay

## 2021-09-09 DIAGNOSIS — Z23 Encounter for immunization: Secondary | ICD-10-CM

## 2021-09-09 MED ORDER — PFIZER COVID-19 VAC BIVALENT 30 MCG/0.3ML IM SUSP
INTRAMUSCULAR | 0 refills | Status: DC
  Filled 2021-09-09: qty 0.3, 1d supply, fill #0

## 2021-09-09 NOTE — Progress Notes (Signed)
° °  Covid-19 Vaccination Clinic  Name:  Shelia Diaz    MRN: [id number] DOB: [birth year]  09/09/2021  [name] was observed post Covid-19 immunization for 15 minutes without incident. She was provided with Vaccine Information Sheet and instruction to access the V-Safe system.   [name] was instructed to call 911 with any severe reactions post vaccine: Difficulty breathing  Swelling of face and throat  A fast heartbeat  A bad rash all over body  Dizziness and weakness   Immunizations Administered     Name Date Dose VIS Date Route   Pfizer Covid-19 Vaccine Bivalent Booster 09/09/2021  3:44 PM 0.3 mL 05/06/2021 Intramuscular   Manufacturer: [employer], [employer]   Lot: [id number]   NDC: [phone]

## 2021-09-29 ENCOUNTER — Other Ambulatory Visit: Payer: Self-pay | Admitting: Internal Medicine

## 2021-09-29 DIAGNOSIS — Z1231 Encounter for screening mammogram for malignant neoplasm of breast: Secondary | ICD-10-CM

## 2021-10-07 ENCOUNTER — Ambulatory Visit
Admission: RE | Admit: 2021-10-07 | Discharge: 2021-10-07 | Disposition: A | Discharge status: Home / Self Care | Payer: BC Managed Care – PPO | Source: Ambulatory Visit

## 2021-10-07 DIAGNOSIS — Z1231 Encounter for screening mammogram for malignant neoplasm of breast: Secondary | ICD-10-CM

## 2021-10-09 ENCOUNTER — Other Ambulatory Visit: Payer: Self-pay | Admitting: Internal Medicine

## 2021-10-09 DIAGNOSIS — R928 Other abnormal and inconclusive findings on diagnostic imaging of breast: Secondary | ICD-10-CM

## 2021-10-27 ENCOUNTER — Ambulatory Visit
Admission: RE | Admit: 2021-10-27 | Discharge: 2021-10-27 | Disposition: A | Discharge status: Home / Self Care | Payer: BC Managed Care – PPO | Source: Ambulatory Visit | Attending: Internal Medicine | Admitting: Internal Medicine

## 2021-10-27 ENCOUNTER — Other Ambulatory Visit: Payer: Self-pay | Admitting: Internal Medicine

## 2021-10-27 DIAGNOSIS — R928 Other abnormal and inconclusive findings on diagnostic imaging of breast: Secondary | ICD-10-CM

## 2021-10-27 DIAGNOSIS — N6320 Unspecified lump in the left breast, unspecified quadrant: Secondary | ICD-10-CM

## 2021-10-29 ENCOUNTER — Encounter: Payer: Self-pay | Admitting: Internal Medicine

## 2022-04-28 ENCOUNTER — Ambulatory Visit
Admission: RE | Admit: 2022-04-28 | Discharge: 2022-04-28 | Disposition: A | Discharge status: Home / Self Care | Payer: BC Managed Care – PPO | Source: Ambulatory Visit | Attending: Internal Medicine | Admitting: Internal Medicine

## 2022-04-28 ENCOUNTER — Other Ambulatory Visit: Payer: Self-pay | Admitting: Internal Medicine

## 2022-04-28 DIAGNOSIS — N6320 Unspecified lump in the left breast, unspecified quadrant: Secondary | ICD-10-CM

## 2022-06-04 ENCOUNTER — Telehealth: Payer: Self-pay | Admitting: *Deleted

## 2022-06-04 NOTE — Telephone Encounter (Signed)
Transition Care Management Follow-up Telephone Call Date of discharge and from where: 06/03/22 [facility] How have you been since you were released from the hospital? Pt stated, little better but still little pain lower abdomen, and gassy, still not able to have bowel movement. Any questions or concerns? No  Items Reviewed: Did the pt receive and understand the discharge instructions provided? Yes  Medications obtained and verified? Yes  Other? Yes  Any new allergies since your discharge? No  Dietary orders reviewed? Yes Do you have support at home? Yes   Home Care and Equipment/Supplies: Were home health services ordered? no If so, what is the name of the agency? N/A  Has the agency set up a time to come to the patient's home? no Were any new equipment or medical supplies ordered?  No What is the name of the medical supply agency? N/a Were you able to get the supplies/equipment? no Do you have any questions related to the use of the equipment or supplies? No  Functional Questionnaire: (I = Independent and D = Dependent) ADLs: I  Bathing/Dressing- I   Meal Prep- I   Eating- I  Maintaining continence- I   Transferring/Ambulation- I  Managing Meds- I  Follow up appointments reviewed:  PCP Hospital f/u appt confirmed? Yes  Scheduled to see Dr. Sharlet Salina on 06/09/22 @ 10:20 am. [facility] f/u appt confirmed? Yes  Scheduled to see Post surgery on Nov 6 @ 3:00 pm. Are transportation arrangements needed? Yes  If their condition worsens, is the pt aware to call PCP or go to the Emergency Dept.? Yes Was the patient provided with contact information for the PCP's office or ED? Yes Was to pt encouraged to call back with questions or concerns? Yes

## 2022-06-09 ENCOUNTER — Encounter: Payer: Self-pay | Admitting: Internal Medicine

## 2022-06-09 ENCOUNTER — Ambulatory Visit: Payer: BC Managed Care – PPO | Admitting: Internal Medicine

## 2022-06-09 NOTE — Progress Notes (Signed)
   Subjective:   Patient ID: Shelia Diaz, female    DOB: [birth year], 41 y.o.   MRN: [id number]  HPI The patient is a 41 YO female coming in for follow up recent surgery. Is eating bariatric liquid diet currently. Is not having problems with bowels or urination. Bowels still more loose not diarrhea. No vomiting. Mild nausea. Does have mild pain mostly with deep breath. Not taking pain meds currently.   PMH, [facility], social history reviewed and updated  Review of Systems  Constitutional: Negative.   HENT: Negative.    Eyes: Negative.   Respiratory:  Negative for cough, chest tightness and shortness of breath.   Cardiovascular:  Negative for chest pain, palpitations and leg swelling.  Gastrointestinal:  Negative for abdominal distention, abdominal pain, constipation, diarrhea, nausea and vomiting.  Musculoskeletal: Negative.   Skin: Negative.   Neurological: Negative.   Psychiatric/Behavioral: Negative.      Objective:  Physical Exam Constitutional:      Appearance: [name]. She is obese.  HENT:     Head: Normocephalic and atraumatic.  Cardiovascular:     Rate and Rhythm: Normal rate and regular rhythm.  Pulmonary:     Effort: Pulmonary effort is normal. No respiratory distress.     Breath sounds: Normal breath sounds. No wheezing or rales.  Abdominal:     General: Bowel sounds are normal. There is no distension.     Palpations: Abdomen is soft.     Tenderness: There is no abdominal tenderness. There is no rebound.  Musculoskeletal:     Cervical back: Normal range of motion.  Skin:    General: Skin is warm and dry.  Neurological:     Mental Status: She is alert and oriented to person, place, and time.     Coordination: Coordination normal.     Vitals:   06/09/22 1037  BP: 108/76  Pulse: 68  SpO2: 98%  Weight: 280 lb (127 kg)  Height: 5\' 4"  (1.626 m)    Assessment & Plan:  Visit time 15 minutes in face to face communication with patient and  coordination of care, additional 15 minutes spent in record review, coordination or care, ordering tests, communicating/referring to other healthcare professionals, documenting in medical records all on the same day of the visit for total time 30 minutes spent on the visit.

## 2022-06-09 NOTE — Assessment & Plan Note (Signed)
Recent surgery for duodenal switch. Reviewed hospital notes, op note, d/c summary. She is following diet and getting adequate intake. She is down about 19 pounds since surgery. She is following up with surgery in 2-3 weeks. No issues with bowels or urine today. Encouraged to continue to monitor closely. Use tylenol as needed for pain. Continue her multivitamin as this was a second weight loss procedure she is higher risk for malabsorption.

## 2022-06-18 ENCOUNTER — Ambulatory Visit (INDEPENDENT_AMBULATORY_CARE_PROVIDER_SITE_OTHER): Payer: BC Managed Care – PPO | Admitting: Internal Medicine

## 2022-06-18 ENCOUNTER — Encounter: Payer: Self-pay | Admitting: Internal Medicine

## 2022-06-18 DIAGNOSIS — L02411 Cutaneous abscess of right axilla: Secondary | ICD-10-CM

## 2022-06-18 MED ORDER — SULFAMETHOXAZOLE-TRIMETHOPRIM 800-160 MG PO TABS: 1.0000 | ORAL_TABLET | Freq: Two times a day (BID) | ORAL | 0 refills | Status: AC

## 2022-06-18 NOTE — Progress Notes (Signed)
Incision and Drainage Procedure Note  Pre-operative Diagnosis: right axilla absccess  Post-operative Diagnosis: same  Indications: pain, swelling, lack of resolution with antibiotics  Anesthesia: 1% plain lidocaine  Procedure Details  The procedure, risks and complications have been discussed in detail (including, but not limited to airway compromise, infection, bleeding) with the patient, and the patient has signed consent to the procedure.  The skin was sterilely prepped and draped over the affected area in the usual fashion. After adequate local anesthesia, I&D with a #11 blade was performed on the right axilla. Purulent drainage: present The patient was observed until stable.  Findings: Abscess, 30 cc purulent material  EBL: 3 cc's  Drains: none  Condition: Tolerated procedure well and Stable   Complications: none.

## 2022-06-18 NOTE — Patient Instructions (Signed)
We have sent in bactrim to take 1 pill twice a day for 1 week to clear the remaining infection.

## 2022-06-18 NOTE — Progress Notes (Signed)
   Subjective:   Patient ID: Shelia Diaz, female    DOB: [birth year], 41 y.o.   MRN: [id number]  HPI The patient is a 41 YO female coming in for left breast/axilla abscess. Overall worsening on doxycycline now.  Review of Systems  Constitutional: Negative.   HENT: Negative.    Eyes: Negative.   Respiratory:  Negative for cough, chest tightness and shortness of breath.   Cardiovascular:  Negative for chest pain, palpitations and leg swelling.  Gastrointestinal:  Negative for abdominal distention, abdominal pain, constipation, diarrhea, nausea and vomiting.  Musculoskeletal: Negative.   Skin:  Positive for color change and wound. Negative for rash.  Neurological: Negative.   Psychiatric/Behavioral: Negative.      Objective:  Physical Exam Constitutional:      Appearance: [name]. She is obese.  HENT:     Head: Normocephalic and atraumatic.  Cardiovascular:     Rate and Rhythm: Normal rate and regular rhythm.  Pulmonary:     Effort: Pulmonary effort is normal. No respiratory distress.     Breath sounds: Normal breath sounds. No wheezing or rales.  Abdominal:     General: Bowel sounds are normal. There is no distension.     Palpations: Abdomen is soft.     Tenderness: There is no abdominal tenderness. There is no rebound.  Musculoskeletal:     Cervical back: Normal range of motion.  Skin:    General: Skin is warm and dry.     Comments: Abscess right axilla with fluctuance and about 10 cm by 5 cm oval, painful.   Neurological:     Mental Status: She is alert and oriented to person, place, and time.     Coordination: Coordination normal.     Vitals:   06/18/22 1509  BP: 122/74  Pulse: 75  Temp: 98.6 F (37 C)  TempSrc: Oral  SpO2: 98%  Weight: 283 lb (128.4 kg)  Height: 5\' 4"  (1.626 m)    Assessment & Plan:

## 2022-06-18 NOTE — Assessment & Plan Note (Signed)
I and D performed in office (see procedure note). Tolerated well. Still with area about 1cm circular hard at the edge of area which was not amenable to I and D. Needs change antibiotic as she is currently on doxycycline and this lesion is growing in size. Rx bactrim 1 week. Call or return if not improved. Given care instructions for this area.

## 2022-07-07 ENCOUNTER — Encounter: Payer: BC Managed Care – PPO | Admitting: Internal Medicine

## 2022-07-09 ENCOUNTER — Other Ambulatory Visit (HOSPITAL_COMMUNITY)
Admission: RE | Admit: 2022-07-09 | Discharge: 2022-07-09 | Disposition: A | Discharge status: Home / Self Care | Payer: BC Managed Care – PPO | Source: Ambulatory Visit | Attending: Internal Medicine | Admitting: Internal Medicine

## 2022-07-09 ENCOUNTER — Encounter: Payer: Self-pay | Admitting: Internal Medicine

## 2022-07-09 ENCOUNTER — Other Ambulatory Visit: Payer: Self-pay

## 2022-07-09 ENCOUNTER — Ambulatory Visit (INDEPENDENT_AMBULATORY_CARE_PROVIDER_SITE_OTHER): Payer: BC Managed Care – PPO | Admitting: Internal Medicine

## 2022-07-09 VITALS — BP 128/84 | HR 69 | Temp 98.3°F | Ht 64.0 in | Wt 279.2 lb

## 2022-07-09 DIAGNOSIS — Z124 Encounter for screening for malignant neoplasm of cervix: Secondary | ICD-10-CM | POA: Diagnosis present

## 2022-07-09 DIAGNOSIS — J4520 Mild intermittent asthma, uncomplicated: Secondary | ICD-10-CM

## 2022-07-09 DIAGNOSIS — Z0000 Encounter for general adult medical examination without abnormal findings: Secondary | ICD-10-CM

## 2022-07-09 MED ORDER — ALBUTEROL SULFATE HFA 108 (90 BASE) MCG/ACT IN AERS: 2.0000 | INHALATION_SPRAY | Freq: Four times a day (QID) | RESPIRATORY_TRACT | 0 refills | Status: AC | PRN

## 2022-07-09 NOTE — Assessment & Plan Note (Signed)
Flu shot declines. Covid-19 counseled. Tetanus up to date. Pap smear collected today. Counseled about sun safety and mole surveillance. Counseled about the dangers of distracted driving. Given 10 year screening recommendations.

## 2022-07-09 NOTE — Progress Notes (Signed)
   Subjective:   Patient ID: Shelia Diaz, female    DOB: [birth year], 41 y.o.   MRN: [id number]  HPI The patient is here for physical.  PMH, [facility], social history reviewed and updated  Review of Systems  Constitutional: Negative.   HENT: Negative.    Eyes: Negative.   Respiratory:  Negative for cough, chest tightness and shortness of breath.   Cardiovascular:  Negative for chest pain, palpitations and leg swelling.  Gastrointestinal:  Negative for abdominal distention, abdominal pain, constipation, diarrhea, nausea and vomiting.  Musculoskeletal: Negative.   Skin: Negative.   Neurological: Negative.   Psychiatric/Behavioral: Negative.      Objective:  Physical Exam Exam conducted with a chaperone present.  Constitutional:      Appearance: [name].  HENT:     Head: Normocephalic and atraumatic.  Cardiovascular:     Rate and Rhythm: Normal rate and regular rhythm.  Pulmonary:     Effort: Pulmonary effort is normal. No respiratory distress.     Breath sounds: Normal breath sounds. No wheezing or rales.  Abdominal:     General: Bowel sounds are normal. There is no distension.     Palpations: Abdomen is soft.     Tenderness: There is no abdominal tenderness. There is no rebound.  Genitourinary:    Comments: Pap smear collected Musculoskeletal:     Cervical back: Normal range of motion.  Skin:    General: Skin is warm and dry.  Neurological:     Mental Status: She is alert and oriented to person, place, and time.     Coordination: Coordination normal.     Vitals:   07/09/22 1523  BP: 128/84  Pulse: 69  Temp: 98.3 F (36.8 C)  TempSrc: Oral  SpO2: 97%  Weight: 279 lb 4 oz (126.7 kg)  Height: 5\' 4"  (1.626 m)    Assessment & Plan:

## 2022-07-09 NOTE — Assessment & Plan Note (Signed)
Weight decreasing and s/p weight loss surgery.

## 2022-07-09 NOTE — Assessment & Plan Note (Signed)
Uses albuterol prn and rarely. Refilled today.

## 2022-07-13 LAB — CYTOLOGY - PAP
Adequacy: ABSENT
Comment: NEGATIVE
Diagnosis: NEGATIVE
High risk HPV: NEGATIVE

## 2022-10-09 ENCOUNTER — Ambulatory Visit
Admission: EM | Admit: 2022-10-09 | Discharge: 2022-10-09 | Disposition: A | Discharge status: Home / Self Care | Payer: BC Managed Care – PPO | Attending: Physician Assistant | Admitting: Physician Assistant

## 2022-10-09 ENCOUNTER — Ambulatory Visit: Payer: BC Managed Care – PPO

## 2022-10-09 DIAGNOSIS — L0291 Cutaneous abscess, unspecified: Secondary | ICD-10-CM | POA: Diagnosis not present

## 2022-10-09 MED ORDER — DOXYCYCLINE HYCLATE 100 MG PO CAPS: 100.0000 mg | ORAL_CAPSULE | Freq: Two times a day (BID) | ORAL | 0 refills | Status: DC

## 2022-10-09 MED ORDER — CLINDAMYCIN HCL 300 MG PO CAPS: 300.0000 mg | ORAL_CAPSULE | Freq: Three times a day (TID) | ORAL | 0 refills | Status: AC

## 2022-10-09 MED ORDER — KETOROLAC TROMETHAMINE 30 MG/ML IJ SOLN
30.0000 mg | Freq: Once | INTRAMUSCULAR | Status: AC
  Administered 2022-10-09: 30 mg via INTRAMUSCULAR

## 2022-10-09 NOTE — ED Provider Notes (Signed)
[facility]    CSN: [id number] Arrival date & time: 10/09/22  1141      History   Chief Complaint Chief Complaint  Patient presents with   Abscess    HPI Shelia Diaz is a 42 y.o. female.   Patient here today for evaluation of abscess to her right breast that is been present for 4 days.  She has known hidradenitis suppurativa and states that this is a typical abscess formation for her.  She does report that the area is painful.  She just started taking Humira about a month ago and is also taking doxycycline that she was prescribed.  She has not had fever.  The history is provided by the patient.  Abscess Associated symptoms: no fever, no nausea and no vomiting     Past Medical History:  Diagnosis Date   Allergy-induced asthma    Chicken pox    History of anemia     Patient Active Problem List   Diagnosis Date Noted   Acute low back pain with bilateral sciatica 07/25/2021   Routine general medical examination at a health care facility 03/31/2021   Morbid obesity ([place]) 07/29/2015   Hidradenitis suppurativa 07/29/2015   Allergy-induced asthma 07/29/2015    Past Surgical History:  Procedure Laterality Date   LAPAROSCOPIC GASTRIC SLEEVE RESECTION  03/16/2016    OB History   No obstetric history on file.      Home Medications    Prior to Admission medications   Medication Sig Start Date End Date Taking? Authorizing Provider  clindamycin (CLEOCIN) 300 MG capsule Take 1 capsule (300 mg total) by mouth 3 (three) times daily for 7 days. 10/09/22 10/16/22 Yes Francene Finders, PA-C  doxycycline (VIBRAMYCIN) 100 MG capsule Take 1 capsule (100 mg total) by mouth 2 (two) times daily. 10/09/22  Yes Francene Finders, PA-C  albuterol (VENTOLIN HFA) 108 (90 Base) MCG/ACT inhaler Inhale 2 puffs into the lungs every 6 (six) hours as needed for wheezing or shortness of breath. 07/09/22   Hoyt Koch, MD  Calcium 500 MG CHEW Chew 1 each 3 (three) times daily  by mouth.    [provider]  cetirizine (ZYRTEC) 10 MG tablet Take 10 mg daily as needed by mouth.    [provider]  Iron Carbonyl-Vitamin C-FOS 30-10-25 MG CHEW Chew by mouth.    [provider]  Multiple Vitamin (MULTIVITAMIN) capsule Take by mouth.    [provider]  vitamin B-12 (CYANOCOBALAMIN) 1000 MCG tablet Take by mouth.    [provider]    Family History Family History  Problem Relation Age of Onset   Heart disease Mother    Stroke Mother    Hypertension Mother    Heart disease Father    Stroke Father    Arthritis Paternal Grandfather     Social History Social History   Tobacco Use   Smoking status: Never   Smokeless tobacco: Never  Vaping Use   Vaping Use: Never used  Substance Use Topics   Alcohol use: No    Alcohol/week: 0.0 standard drinks of alcohol   Drug use: No     Allergies   Penicillins   Review of Systems Review of Systems  Constitutional:  Negative for chills and fever.  Eyes:  Negative for discharge and redness.  Gastrointestinal:  Negative for nausea and vomiting.  Skin:  Positive for wound. Negative for color change.     Physical Exam Triage Vital Signs ED  Triage Vitals  Enc Vitals Group     BP 10/09/22 1256 (!) 145/71     Pulse Rate 10/09/22 1256 76     Resp 10/09/22 1256 18     Temp 10/09/22 1256 98.4 F (36.9 C)     Temp Source 10/09/22 1256 Oral     SpO2 10/09/22 1256 98 %     Weight --      Height --      Head Circumference --      Peak Flow --      Pain Score 10/09/22 1254 7     Pain Loc --      Pain Edu? --      Excl. in [place]? --    No data found.  Updated Vital Signs BP (!) 145/71 (BP Location: Left Arm)   Pulse 76   Temp 98.4 F (36.9 C) (Oral)   Resp 18   SpO2 98%   Visual Acuity Right Eye Distance:   Left Eye Distance:   Bilateral Distance:    Right Eye Near:   Left Eye Near:    Bilateral Near:     Physical Exam Vitals and nursing note reviewed.   Constitutional:      General: She is not in acute distress.    Appearance: Normal appearance. She is not ill-appearing.  HENT:     Head: Normocephalic and atraumatic.  Eyes:     Conjunctiva/sclera: Conjunctivae normal.  Cardiovascular:     Rate and Rhythm: Normal rate.  Pulmonary:     Effort: Pulmonary effort is normal. No respiratory distress.  Chest:       Comments: Approximately 3 cm multi lobulated area of induration with no fluctuance noted, scarring noted across bilateral chest Neurological:     Mental Status: She is alert.  Psychiatric:        Mood and Affect: Mood normal.        Behavior: Behavior normal.        Thought Content: Thought content normal.      UC Treatments / Results  Labs (all labs ordered are listed, but only abnormal results are displayed) Labs Reviewed - No data to display  EKG   Radiology No results found.  Procedures Procedures (including critical care time)  Medications Ordered in UC Medications  ketorolac (TORADOL) 30 MG/ML injection 30 mg (30 mg Intramuscular Given 10/09/22 1318)    Initial Impression / Assessment and Plan / UC Course  I have reviewed the triage vital signs and the nursing notes.  Pertinent labs & imaging results that were available during my care of the patient were reviewed by me and considered in my medical decision making (see chart for details).    Discussed recommendation against incision and drainage in setting of HS and recommended doxycycline and will add clindamycin. Advised follow up with derm if no improvement or with any further concerns.   Final Clinical Impressions(s) / UC Diagnoses   Final diagnoses:  Abscess   Discharge Instructions   None    ED Prescriptions     Medication Sig Dispense Auth. Provider   clindamycin (CLEOCIN) 300 MG capsule Take 1 capsule (300 mg total) by mouth 3 (three) times daily for 7 days. 21 capsule Ewell Poe F, PA-C   doxycycline (VIBRAMYCIN) 100 MG capsule  Take 1 capsule (100 mg total) by mouth 2 (two) times daily. 20 capsule Francene Finders, PA-C      PDMP not reviewed this encounter.   Francene Finders,  PA-C 10/09/22 1405

## 2022-10-09 NOTE — ED Triage Notes (Signed)
Pt presents with abscess on right breast X 4 days; pt states she has HS and gets them frequently.

## 2022-10-15 ENCOUNTER — Ambulatory Visit: Payer: BC Managed Care – PPO | Admitting: Internal Medicine

## 2022-11-03 ENCOUNTER — Ambulatory Visit
Admission: RE | Admit: 2022-11-03 | Discharge: 2022-11-03 | Disposition: A | Discharge status: Home / Self Care | Payer: BC Managed Care – PPO | Source: Ambulatory Visit | Attending: Internal Medicine | Admitting: Internal Medicine

## 2022-11-03 ENCOUNTER — Other Ambulatory Visit: Payer: Self-pay | Admitting: Internal Medicine

## 2022-11-03 DIAGNOSIS — N6320 Unspecified lump in the left breast, unspecified quadrant: Secondary | ICD-10-CM

## 2022-11-09 ENCOUNTER — Ambulatory Visit: Payer: BC Managed Care – PPO | Admitting: Internal Medicine

## 2022-11-09 ENCOUNTER — Encounter: Payer: Self-pay | Admitting: Internal Medicine

## 2022-11-09 VITALS — BP 160/88 | HR 60 | Temp 98.0°F | Ht 64.0 in | Wt 265.5 lb

## 2022-11-09 DIAGNOSIS — I10 Essential (primary) hypertension: Secondary | ICD-10-CM | POA: Diagnosis not present

## 2022-11-09 DIAGNOSIS — L732 Hidradenitis suppurativa: Secondary | ICD-10-CM

## 2022-11-09 NOTE — Progress Notes (Unsigned)
   Subjective:   Patient ID: Shelia Diaz, female    DOB: 1980/12/24, 42 y.o.   MRN: [id number]  HPI The patient is a 42 YO female coming in for cyst right breast.   Review of Systems  Constitutional: Negative.   HENT: Negative.    Eyes: Negative.   Respiratory:  Negative for cough, chest tightness and shortness of breath.   Cardiovascular:  Negative for chest pain, palpitations and leg swelling.  Gastrointestinal:  Negative for abdominal distention, abdominal pain, constipation, diarrhea, nausea and vomiting.  Musculoskeletal: Negative.   Skin: Negative.   Neurological: Negative.   Psychiatric/Behavioral: Negative.      Objective:  Physical Exam Constitutional:      Appearance: [name]. She is obese.  HENT:     Head: Normocephalic and atraumatic.  Cardiovascular:     Rate and Rhythm: Normal rate and regular rhythm.  Pulmonary:     Effort: Pulmonary effort is normal. No respiratory distress.     Breath sounds: Normal breath sounds. No wheezing or rales.  Abdominal:     General: Bowel sounds are normal. There is no distension.     Palpations: Abdomen is soft.     Tenderness: There is no abdominal tenderness. There is no rebound.  Musculoskeletal:     Cervical back: Normal range of motion.  Skin:    General: Skin is warm and dry.     Comments: Cyst right medial breast without purulent drainage or abscess amenable to I and D.   Neurological:     Mental Status: She is alert and oriented to person, place, and time.     Coordination: Coordination normal.     Vitals:   11/09/22 1544 11/09/22 1547  BP: (!) 160/88 (!) 160/88  Pulse: 60   Temp: 98 F (36.7 C)   TempSrc: Oral   SpO2: 99%   Weight: 265 lb 8 oz (120.4 kg)   Height: '5\' 4"'$  (1.626 m)     Assessment & Plan:

## 2022-11-09 NOTE — Patient Instructions (Signed)
Keep taking the doxycycline for 1-2 months to see if this heals

## 2022-11-11 ENCOUNTER — Encounter: Payer: Self-pay | Admitting: Internal Medicine

## 2022-11-11 DIAGNOSIS — I10 Essential (primary) hypertension: Secondary | ICD-10-CM | POA: Insufficient documentation

## 2022-11-11 NOTE — Assessment & Plan Note (Signed)
With active flare today. Just started doxycycline from derm and will take for now. Is also taking humira and overall flares have decreased. We discussed potentially needing to switch antibiotics if not resolving. She will let us know.

## 2022-11-11 NOTE — Assessment & Plan Note (Signed)
BP above goal and taking spironolactone 50 mg daily. She has not taken yet and will check at home on meds. Prior BP at goal.

## 2022-11-29 ENCOUNTER — Encounter: Payer: Self-pay | Admitting: Internal Medicine

## 2022-11-29 ENCOUNTER — Ambulatory Visit: Payer: BC Managed Care – PPO | Admitting: Internal Medicine

## 2022-11-29 VITALS — BP 145/95 | HR 68 | Temp 98.3°F | Ht 64.0 in | Wt 261.0 lb

## 2022-11-29 DIAGNOSIS — I10 Essential (primary) hypertension: Secondary | ICD-10-CM | POA: Diagnosis not present

## 2022-11-29 DIAGNOSIS — L732 Hidradenitis suppurativa: Secondary | ICD-10-CM | POA: Diagnosis not present

## 2022-11-29 NOTE — Patient Instructions (Addendum)
You can use hydrocortisone on the area daily for 1-2 weeks at a time.  [facility]): [address] [place], [place] [address] [phone]  They offer free delivery for blood pressure cuff to anywhere in [facility]

## 2022-11-29 NOTE — Assessment & Plan Note (Signed)
No flare in the perineum. Advised to use hydrocortisone 1% daily prn for 1-2 weeks intermittently there for the itching and stop using the wipes. Use water if needed to see if this is the cause.

## 2022-11-29 NOTE — Assessment & Plan Note (Signed)
BP elevated today and previously had been normal. She will get a BP cuff and start tracking at home. Takes spironolactone for other reasons but could be adjusted if needed.

## 2022-11-29 NOTE — Progress Notes (Signed)
   Subjective:   Patient ID: Shelia Diaz, female    DOB: 09-Oct-1980, 42 y.o.   MRN: [id number]  Allergic Reaction Associated symptoms include a rash. Pertinent negatives include no abdominal pain, chest pain, coughing, diarrhea or vomiting.   The patient is a 42 YO female coming in for itching and rash perineally. May be related to some wipes she uses intermittently.   Review of Systems  Constitutional: Negative.   HENT: Negative.    Eyes: Negative.   Respiratory:  Negative for cough, chest tightness and shortness of breath.   Cardiovascular:  Negative for chest pain, palpitations and leg swelling.  Gastrointestinal:  Negative for abdominal distention, abdominal pain, constipation, diarrhea, nausea and vomiting.  Musculoskeletal: Negative.   Skin:  Positive for rash.  Neurological: Negative.   Psychiatric/Behavioral: Negative.      Objective:  Physical Exam Constitutional:      Appearance: [name].  HENT:     Head: Normocephalic and atraumatic.  Cardiovascular:     Rate and Rhythm: Normal rate and regular rhythm.  Pulmonary:     Effort: Pulmonary effort is normal. No respiratory distress.     Breath sounds: Normal breath sounds. No wheezing or rales.  Abdominal:     General: Bowel sounds are normal. There is no distension.     Palpations: Abdomen is soft.     Tenderness: There is no abdominal tenderness. There is no rebound.  Genitourinary:    Comments: Perineum and thighs with some darkened skin no rash or lesions or abscesses. No stigmata of lichen planus or sclerosis Musculoskeletal:     Cervical back: Normal range of motion.  Skin:    General: Skin is warm and dry.  Neurological:     Mental Status: She is alert and oriented to person, place, and time.     Coordination: Coordination normal.     Vitals:   11/29/22 1006 11/29/22 1008 11/29/22 1025  BP: (!) 160/102 (!) 160/102 (!) 145/95  Pulse: 68    Temp: 98.3 F (36.8 C)    TempSrc: Oral     SpO2: 99%    Weight: 261 lb (118.4 kg)    Height: 5\' 4"  (1.626 m)      Assessment & Plan:  Visit time 17 minutes in face to face communication with patient and coordination of care, additional 3 minutes spent in record review, coordination or care, ordering tests, communicating/referring to other healthcare professionals, documenting in medical records all on the same day of the visit for total time 20 minutes spent on the visit.

## 2022-12-27 ENCOUNTER — Encounter: Payer: Self-pay | Admitting: Internal Medicine

## 2023-01-01 ENCOUNTER — Ambulatory Visit
Admission: RE | Admit: 2023-01-01 | Discharge: 2023-01-01 | Disposition: A | Discharge status: Home / Self Care | Payer: BC Managed Care – PPO | Source: Ambulatory Visit | Attending: Urgent Care | Admitting: Urgent Care

## 2023-01-01 VITALS — BP 139/89 | HR 73 | Temp 97.6°F | Resp 18

## 2023-01-01 DIAGNOSIS — N611 Abscess of the breast and nipple: Secondary | ICD-10-CM

## 2023-01-01 DIAGNOSIS — L732 Hidradenitis suppurativa: Secondary | ICD-10-CM

## 2023-01-01 NOTE — ED Provider Notes (Signed)
[name]    CSN: [id number] Arrival date & time: 01/01/23  0848      History   Chief Complaint Chief Complaint  Patient presents with   Abscess    I have a cyst on my breast that needs to be lanced. My dermatologist is not open on the weekend and they have no appointments available on Friday or Monday. The cyst come from Hidradenitis suppurativa. I am current taking antibiotics for treatment - Entered by patient    HPI Shelia Diaz is a 42 y.o. female.    Abscess   Patient presents to urgent care with report of abscess on right breast.  She endorses history of hidradenitis suppurativa and states she is having a flareup.  She states the cyst is increasing in size and causing pain.  Patient takes antibiotic for treatment of at bedtime and is under the care of a dermatologist.  Past Medical History:  Diagnosis Date   Allergy-induced asthma    Chicken pox    History of anemia     Patient Active Problem List   Diagnosis Date Noted   Essential hypertension 11/11/2022   Acute low back pain with bilateral sciatica 07/25/2021   Routine general medical examination at a health care facility 03/31/2021   Morbid obesity (HCC) 07/29/2015   Hidradenitis suppurativa 07/29/2015   Allergy-induced asthma 07/29/2015    Past Surgical History:  Procedure Laterality Date   LAPAROSCOPIC GASTRIC SLEEVE RESECTION  03/16/2016    OB History   No obstetric history on file.      Home Medications    Prior to Admission medications   Medication Sig Start Date End Date Taking? Authorizing Provider  albuterol (VENTOLIN HFA) 108 (90 Base) MCG/ACT inhaler Inhale 2 puffs into the lungs every 6 (six) hours as needed for wheezing or shortness of breath. 07/09/22   Myrlene Broker, MD  Calcium 500 MG CHEW Chew 1 each 3 (three) times daily by mouth.    [provider]  cetirizine (ZYRTEC) 10 MG tablet Take 10 mg daily as needed by mouth.    [provider]   doxycycline (VIBRAMYCIN) 100 MG capsule Take 1 capsule (100 mg total) by mouth 2 (two) times daily. 10/09/22   Tomi Bamberger, PA-C  HUMIRA, 2 PEN, 40 MG/0.4ML PNKT SMARTSIG:40 Milligram(s) SUB-Q Once a Week 11/25/22   [provider]  Iron Carbonyl-Vitamin C-FOS 30-10-25 MG CHEW Chew by mouth.    [provider]  Multiple Vitamin (MULTIVITAMIN) capsule Take by mouth.    [provider]  Prenat-FeAsp-Meth-FA-DHA w/o A (PRENATE ESSENTIAL) 18-0.6-0.4-300 MG CAPS Take by mouth. 07/12/22   [provider]  senna-docusate (SENOKOT-S) 8.6-50 MG tablet Take by mouth. 05/17/22 05/17/23  [provider]  silver sulfADIAZINE (SILVADENE) 1 % cream  06/04/21   [provider]  spironolactone (ALDACTONE) 50 MG tablet Take by mouth. 08/05/22 08/05/23  [provider]  vitamin B-12 (CYANOCOBALAMIN) 1000 MCG tablet Take by mouth.    [provider]    Family History Family History  Problem Relation Age of Onset   Heart disease Mother    Stroke Mother    Hypertension Mother    Heart disease Father    Stroke Father    Arthritis Paternal Grandfather     Social History Social History   Tobacco Use   Smoking status: Never   Smokeless tobacco: Never  Vaping Use   Vaping Use: Never used  Substance Use Topics   Alcohol  use: No    Alcohol/week: 0.0 standard drinks of alcohol   Drug use: No     Allergies   Penicillins   Review of Systems Review of Systems   Physical Exam Triage Vital Signs ED Triage Vitals [01/01/23 0909]  Enc Vitals Group     BP      Pulse      Resp      Temp      Temp src      SpO2      Weight      Height      Head Circumference      Peak Flow      Pain Score 7     Pain Loc      Pain Edu?      Excl. in GC?    No data found.  Updated Vital Signs LMP 12/29/2022   Visual Acuity Right Eye Distance:   Left Eye Distance:   Bilateral Distance:    Right Eye Near:   Left Eye Near:     Bilateral Near:     Physical Exam Vitals reviewed.  Constitutional:      Appearance: Normal appearance.  Skin:    General: Skin is warm and dry.     Findings: Abscess present.       Neurological:     General: No focal deficit present.     Mental Status: She is alert and oriented to person, place, and time.  Psychiatric:        Mood and Affect: Mood normal.        Behavior: Behavior normal.      UC Treatments / Results  Labs (all labs ordered are listed, but only abnormal results are displayed) Labs Reviewed - No data to display  EKG   Radiology No results found.  Procedures Incision and Drainage  Date/Time: 01/01/2023 9:43 AM  Performed by: Charma Igo, FNP Authorized by: Charma Igo, FNP   Consent:    Consent obtained:  Verbal   Consent given by:  Patient   Risks discussed:  Incomplete drainage and pain   Alternatives discussed:  No treatment and delayed treatment Universal protocol:    Procedure explained and questions answered to patient or proxy's satisfaction: yes     Relevant documents present and verified: yes     Test results available : no     Imaging studies available: no     Required blood products, implants, devices, and special equipment available: no     Site/side marked: no     Immediately prior to procedure, a time out was called: yes     Patient identity confirmed:  Verbally with patient Location:    Type:  Abscess   Size:  2-3 cm   Location:  Trunk   Trunk location:  R breast Pre-procedure details:    Skin preparation:  Povidone-iodine Sedation:    Sedation type:  None Anesthesia:    Anesthesia method:  Local infiltration   Local anesthetic:  Lidocaine 1% WITH epi Procedure type:    Complexity:  Simple Procedure details:    Incision types:  Stab incision   Incision depth:  Dermal   Wound management:  Probed and deloculated   Drainage:  Purulent   Drainage amount:  Copious   Wound treatment:  Wound left open    Packing materials:  1/4 in gauze   Amount 1/4":  12 Post-procedure details:    Procedure completion:  Tolerated  (including critical care time)  Medications Ordered in UC Medications - No data to display  Initial Impression / Assessment and Plan / UC Course  I have reviewed the triage vital signs and the nursing notes.  Pertinent labs & imaging results that were available during my care of the patient were reviewed by me and considered in my medical decision making (see chart for details).   Shelia Diaz is a 42 y.o. female presenting with Abscessed skin lesion on R breast. Patient is afebrile without recent antipyretics, satting well on room air. Overall is well appearing and non-toxic, well hydrated, without respiratory distress. Fluctuant lesion at 4 o clock positive from nipple, R breast. Approx 2 cms from the nipple. No discharge. Dense breast tissue, likely previous HS lesions surrounding.  I&D performed. Copious discharge. See procedure list.  Counseled patient on potential for adverse effects with medications prescribed/recommended today, ER and return-to-clinic precautions discussed, patient verbalized understanding and agreement with care plan.   Final Clinical Impressions(s) / UC Diagnoses   Final diagnoses:  None   Discharge Instructions   None    ED Prescriptions   None    PDMP not reviewed this encounter.   Charma Igo, [place] 01/01/23 [phone]

## 2023-01-01 NOTE — Discharge Instructions (Signed)
Remove packing after 1 day. Reapply antibacterial ointment. Keep covered with a dressing. Watch for signs of local infection.  Follow up with your provider.

## 2023-01-01 NOTE — ED Triage Notes (Signed)
Patient presents to UC for cyst located on right breast. Hx of hidradenitis, states this is a flare-up. Increased in size and pain. Using warm wash cloth, tylenol, antibiotic, and a cream.

## 2023-01-03 ENCOUNTER — Ambulatory Visit (INDEPENDENT_AMBULATORY_CARE_PROVIDER_SITE_OTHER): Payer: BC Managed Care – PPO

## 2023-01-03 ENCOUNTER — Ambulatory Visit
Admission: RE | Admit: 2023-01-03 | Discharge: 2023-01-03 | Disposition: A | Discharge status: Home / Self Care | Payer: BC Managed Care – PPO | Source: Ambulatory Visit | Attending: Family Medicine | Admitting: Family Medicine

## 2023-01-03 VITALS — BP 134/85 | HR 64 | Temp 97.9°F | Resp 16

## 2023-01-03 DIAGNOSIS — R1030 Lower abdominal pain, unspecified: Secondary | ICD-10-CM | POA: Diagnosis not present

## 2023-01-03 LAB — POCT URINALYSIS DIP (MANUAL ENTRY)
Bilirubin, UA: NEGATIVE
Glucose, UA: NEGATIVE mg/dL
Ketones, POC UA: NEGATIVE mg/dL
Leukocytes, UA: NEGATIVE
Nitrite, UA: NEGATIVE
Spec Grav, UA: >=1.03 — AB (ref 1.010–1.025)
Urobilinogen, UA: 0.2 E.U./dL
pH, UA: 6 (ref 5.0–8.0)

## 2023-01-03 LAB — POCT URINE PREGNANCY: Preg Test, Ur: NEGATIVE

## 2023-01-03 MED ORDER — SIMETHICONE 80 MG PO CHEW: 80.0000 mg | CHEWABLE_TABLET | Freq: Four times a day (QID) | ORAL | 0 refills | Status: AC | PRN

## 2023-01-03 NOTE — Discharge Instructions (Addendum)
You were seen today for abdominal pain.  Your urine was normal.  The xray did show a large amount of gas, which may be contributing to your pain.  I have sent out a tablet to help with gas.  I if you have worsening pain, or develop fever, chills, vomiting then please go to the ER for further evaluation.  If this continues then please follow up with your primary care provider.

## 2023-01-03 NOTE — ED Triage Notes (Signed)
Pt c/o abdominal pain sine Saturday afternoon. Reports lower abd. Reports still having normal BMs. Denies n/v or urinary problems. Reached out to weight loss center due to having surgery in September and was advised to go to [facility]. Tylenol helps little  LMP started 4/24 and still on cycle

## 2023-01-03 NOTE — ED Provider Notes (Addendum)
[facility]    CSN: [id number] Arrival date & time: 01/03/23  1507      History   Chief Complaint Chief Complaint  Patient presents with   Abdominal Pain    I have been having lower abdominal pain since Saturday. I had weight loss surgery in September of 2023. I contact the weight loss center and they want me to go to urgent care. I am not pregnant. Not running a fever - Entered by patient    HPI Shelia Diaz is a 42 y.o. female.   Patient is here for low abd pain.  She had weight loss surgery 05/2022.  She started with sharp pain/cramping in her stomach about 2 days ago.  About 9/10 in pain at that time. That pain did not ease for several hrs, but still present.  At this time it is about a 6/10, worsened with drinking water.  Pain across the whole lower abdomen.  She has been eating and drinking the last several days.  No n/v.  No diarrhea or constipation.  No urinary symptoms per se.  LMP was 4/24 (currently on cycle).  No fevers/chills.           Past Medical History:  Diagnosis Date   Allergy-induced asthma    Chicken pox    History of anemia     Patient Active Problem List   Diagnosis Date Noted   Essential hypertension 11/11/2022   Acute low back pain with bilateral sciatica 07/25/2021   Routine general medical examination at a health care facility 03/31/2021   Morbid obesity (HCC) 07/29/2015   Hidradenitis suppurativa 07/29/2015   Allergy-induced asthma 07/29/2015    Past Surgical History:  Procedure Laterality Date   LAPAROSCOPIC GASTRIC SLEEVE RESECTION  03/16/2016    OB History   No obstetric history on file.      Home Medications    Prior to Admission medications   Medication Sig Start Date End Date Taking? Authorizing Provider  albuterol (VENTOLIN HFA) 108 (90 Base) MCG/ACT inhaler Inhale 2 puffs into the lungs every 6 (six) hours as needed for wheezing or shortness of breath. 07/09/22   Myrlene Broker, MD  Calcium  500 MG CHEW Chew 1 each 3 (three) times daily by mouth.    [provider]  cetirizine (ZYRTEC) 10 MG tablet Take 10 mg daily as needed by mouth.    [provider]  doxycycline (VIBRAMYCIN) 100 MG capsule Take 1 capsule (100 mg total) by mouth 2 (two) times daily. 10/09/22   Tomi Bamberger, PA-C  HUMIRA, 2 PEN, 40 MG/0.4ML PNKT SMARTSIG:40 Milligram(s) SUB-Q Once a Week 11/25/22   [provider]  Iron Carbonyl-Vitamin C-FOS 30-10-25 MG CHEW Chew by mouth.    [provider]  Multiple Vitamin (MULTIVITAMIN) capsule Take by mouth.    [provider]  Prenat-FeAsp-Meth-FA-DHA w/o A (PRENATE ESSENTIAL) 18-0.6-0.4-300 MG CAPS Take by mouth. 07/12/22   [provider]  senna-docusate (SENOKOT-S) 8.6-50 MG tablet Take by mouth. 05/17/22 05/17/23  [provider]  silver sulfADIAZINE (SILVADENE) 1 % cream  06/04/21   [provider]  spironolactone (ALDACTONE) 50 MG tablet Take by mouth. 08/05/22 08/05/23  [provider]  vitamin B-12 (CYANOCOBALAMIN) 1000 MCG tablet Take by mouth.    [provider]    Family History Family History  Problem Relation Age of Onset   Heart disease Mother    Stroke Mother    Hypertension Mother    Heart disease  Father    Stroke Father    Arthritis Paternal Grandfather     Social History Social History   Tobacco Use   Smoking status: Never   Smokeless tobacco: Never  Vaping Use   Vaping Use: Never used  Substance Use Topics   Alcohol use: No    Alcohol/week: 0.0 standard drinks of alcohol   Drug use: No     Allergies   Penicillins   Review of Systems Review of Systems  Constitutional: Negative.   HENT: Negative.    Respiratory: Negative.    Cardiovascular: Negative.   Gastrointestinal:  Positive for abdominal pain.  Genitourinary: Negative.   Musculoskeletal: Negative.   Psychiatric/Behavioral: Negative.       Physical Exam Triage Vital Signs ED  Triage Vitals  Enc Vitals Group     BP 01/03/23 1520 134/85     Pulse Rate 01/03/23 1520 64     Resp 01/03/23 1520 16     Temp 01/03/23 1520 97.9 F (36.6 C)     Temp Source 01/03/23 1520 Oral     SpO2 01/03/23 1520 98 %     Weight --      Height --      Head Circumference --      Peak Flow --      Pain Score 01/03/23 1518 7     Pain Loc --      Pain Edu? --      Excl. in GC? --    No data found.  Updated Vital Signs BP 134/85 (BP Location: Left Arm)   Pulse 64   Temp 97.9 F (36.6 C) (Oral)   Resp 16   LMP 12/29/2022   SpO2 98%   Visual Acuity Right Eye Distance:   Left Eye Distance:   Bilateral Distance:    Right Eye Near:   Left Eye Near:    Bilateral Near:     Physical Exam Constitutional:      General: She is not in acute distress.    Appearance: [name]. She is obese. She is not ill-appearing.  Cardiovascular:     Rate and Rhythm: Normal rate and regular rhythm.  Pulmonary:     Effort: Pulmonary effort is normal.     Breath sounds: Normal breath sounds.  Abdominal:     General: Bowel sounds are normal.     Palpations: Abdomen is soft.     Tenderness: There is abdominal tenderness in the epigastric area and left upper quadrant. There is no right CVA tenderness or left CVA tenderness.  Skin:    General: Skin is warm.  Neurological:     General: No focal deficit present.     Mental Status: She is alert.      UC Treatments / Results  Labs (all labs ordered are listed, but only abnormal results are displayed) Labs Reviewed  POCT URINALYSIS DIP (MANUAL ENTRY) - Abnormal; Notable for the following components:      Result Value   Spec Grav, UA >=1.030 (*)    Blood, UA large (*)    Protein Ur, POC trace (*)    All other components within normal limits  POCT URINE PREGNANCY  UPT negative  EKG   Radiology DG Abd 1 View  Result Date: 01/03/2023 CLINICAL DATA:  Abdominal pain. EXAM: ABDOMEN - 1 VIEW COMPARISON:  Upper GI  examination 12/14/2021 FINDINGS: Large amount of bowel gas in the abdomen but nonobstructive pattern. Evidence for stool in the rectum. Limited evaluation  of the lung bases. No evidence for free air. IMPRESSION: Large amount of bowel gas but nonobstructive bowel gas pattern. Electronically Signed   By: Richarda Overlie M.D.   On: 01/03/2023 15:50    Procedures Procedures (including critical care time)  Medications Ordered in UC Medications - No data to display  Initial Impression / Assessment and Plan / UC Course  I have reviewed the triage vital signs and the nursing notes.  Pertinent labs & imaging results that were available during my care of the patient were reviewed by me and considered in my medical decision making (see chart for details).   Final Clinical Impressions(s) / UC Diagnoses   Final diagnoses:  Lower abdominal pain     Discharge Instructions      You were seen today for abdominal pain.  Your urine was normal.  The xray did show a large amount of gas, which may be contributing to your pain.  I have sent out a tablet to help with gas.  I if you have worsening pain, or develop fever, chills, vomiting then please go to the ER for further evaluation.  If this continues then please follow up with your primary care provider.     ED Prescriptions     Medication Sig Dispense Auth. Provider   simethicone (GAS-X) 80 MG chewable tablet Chew 1 tablet (80 mg total) by mouth every 6 (six) hours as needed for flatulence. 30 tablet Jannifer Franklin, MD      PDMP not reviewed this encounter.   Jannifer Franklin, MD 01/03/23 1615    Jannifer Franklin, MD 01/03/23 [phone]

## 2023-01-05 ENCOUNTER — Encounter: Payer: Self-pay | Admitting: Internal Medicine

## 2023-01-10 ENCOUNTER — Encounter: Payer: Self-pay | Admitting: Internal Medicine

## 2023-01-10 ENCOUNTER — Ambulatory Visit: Payer: BC Managed Care – PPO | Admitting: Internal Medicine

## 2023-01-10 DIAGNOSIS — L732 Hidradenitis suppurativa: Secondary | ICD-10-CM

## 2023-01-10 MED ORDER — FLUCONAZOLE 150 MG PO TABS: 150.0000 mg | ORAL_TABLET | ORAL | 0 refills | Status: AC

## 2023-01-10 NOTE — Patient Instructions (Signed)
We have sent in the diflucan to take 1 pill every 3 days for 2 weeks.

## 2023-01-10 NOTE — Progress Notes (Unsigned)
   Subjective:   Patient ID: Shelia Diaz, female    DOB: [birth year], 42 y.o.   MRN: [id number]  HPI The patient is a 42 YO female coming in for concerns.   Review of Systems  Constitutional: Negative.   HENT: Negative.    Eyes: Negative.   Respiratory:  Negative for cough, chest tightness and shortness of breath.   Cardiovascular:  Negative for chest pain, palpitations and leg swelling.  Gastrointestinal:  Negative for abdominal distention, abdominal pain, constipation, diarrhea, nausea and vomiting.  Genitourinary:        Inguinal itching and rash  Musculoskeletal: Negative.   Skin:  Positive for wound.  Neurological: Negative.   Psychiatric/Behavioral: Negative.      Objective:  Physical Exam Constitutional:      Appearance: [name]. She is obese.  HENT:     Head: Normocephalic and atraumatic.  Cardiovascular:     Rate and Rhythm: Normal rate and regular rhythm.  Pulmonary:     Effort: Pulmonary effort is normal. No respiratory distress.     Breath sounds: Normal breath sounds. No wheezing or rales.     Comments: Cyst right breast consistent with healing abscess no purulent pocket or drainage on exam, nontender without redness Abdominal:     General: Bowel sounds are normal. There is no distension.     Palpations: Abdomen is soft.     Tenderness: There is no abdominal tenderness. There is no rebound.  Musculoskeletal:     Cervical back: Normal range of motion.  Skin:    General: Skin is warm and dry.  Neurological:     Mental Status: She is alert and oriented to person, place, and time.     Coordination: Coordination normal.     Vitals:   01/10/23 1520  BP: 100/80  Pulse: (!) 53  Temp: 98.4 F (36.9 C)  TempSrc: Oral  SpO2: 99%  Weight: 261 lb (118.4 kg)  Height: 5\' 4"  (1.626 m)    Assessment & Plan:

## 2023-01-13 NOTE — Assessment & Plan Note (Signed)
Working on weight loss and counseled about diet to help.

## 2023-01-13 NOTE — Assessment & Plan Note (Signed)
On chronic antibiotics doxycycline likely has caused inguinal yeast infection. Rx diflucan 3 pills to take every 3 days.

## 2023-02-28 ENCOUNTER — Ambulatory Visit: Payer: BC Managed Care – PPO | Admitting: Internal Medicine

## 2023-02-28 ENCOUNTER — Encounter: Payer: Self-pay | Admitting: Internal Medicine

## 2023-02-28 VITALS — BP 124/80 | HR 64 | Temp 98.3°F | Ht 64.0 in | Wt 253.0 lb

## 2023-02-28 DIAGNOSIS — L732 Hidradenitis suppurativa: Secondary | ICD-10-CM

## 2023-02-28 MED ORDER — TERCONAZOLE 0.8 % VA CREA: 1.0000 | TOPICAL_CREAM | Freq: Every day | VAGINAL | 11 refills | Status: DC

## 2023-02-28 NOTE — Progress Notes (Unsigned)
   Subjective:   Patient ID: Shelia Diaz, female    DOB: 1980-11-19, 42 y.o.   MRN: [id number]  HPI The patient is a 42 YO female coming in for vaginal itching worsens as the day progresses. Did take fluconazole we prescribed for 2 weeks which helped but this resumed once she stopped. She is still on chronic doxycycline for her HS.  Review of Systems  Constitutional: Negative.   HENT: Negative.    Eyes: Negative.   Respiratory:  Negative for cough, chest tightness and shortness of breath.   Cardiovascular:  Negative for chest pain, palpitations and leg swelling.  Gastrointestinal:  Negative for abdominal distention, abdominal pain, constipation, diarrhea, nausea and vomiting.  Genitourinary:        Vaginal itching  Musculoskeletal: Negative.   Skin: Negative.   Neurological: Negative.   Psychiatric/Behavioral: Negative.      Objective:  Physical Exam Constitutional:      Appearance: [name].  HENT:     Head: Normocephalic and atraumatic.  Cardiovascular:     Rate and Rhythm: Normal rate and regular rhythm.  Pulmonary:     Effort: Pulmonary effort is normal. No respiratory distress.     Breath sounds: Normal breath sounds. No wheezing or rales.  Abdominal:     General: Bowel sounds are normal. There is no distension.     Palpations: Abdomen is soft.     Tenderness: There is no abdominal tenderness. There is no rebound.  Musculoskeletal:     Cervical back: Normal range of motion.  Skin:    General: Skin is warm and dry.  Neurological:     Mental Status: She is alert and oriented to person, place, and time.     Coordination: Coordination normal.     Vitals:   02/28/23 1559  BP: 124/80  Pulse: 64  Temp: 98.3 F (36.8 C)  TempSrc: Oral  SpO2: 99%  Weight: 253 lb (114.8 kg)  Height: 5\' 4"  (1.626 m)    Assessment & Plan:

## 2023-02-28 NOTE — Patient Instructions (Addendum)
We have sent in the cream to use daily as needed.

## 2023-03-01 NOTE — Assessment & Plan Note (Signed)
She is still on chronic doxycycline which is causing her to be unable to fully clear yeast infection. Will prescribe terconazole to use nightly until off antibiotics.

## 2023-04-08 IMAGING — MG DIGITAL DIAGNOSTIC BILAT W/ TOMO W/ CAD
5 of 10 series · 5 of 30 positions shown · non-contrast
Comparison: Baseline mammogram 10/07/2021.

CLINICAL DATA: Recall from baseline screening mammography, possible
asymmetry in the central RIGHT breast visible only on the CC view,
possible mass involving the outer LEFT breast, and possible abnormal
lymph nodes in both axilla. The patient states that she has
hidradenitis suppurativa involving both axilla.



[L MLO synth-2D]
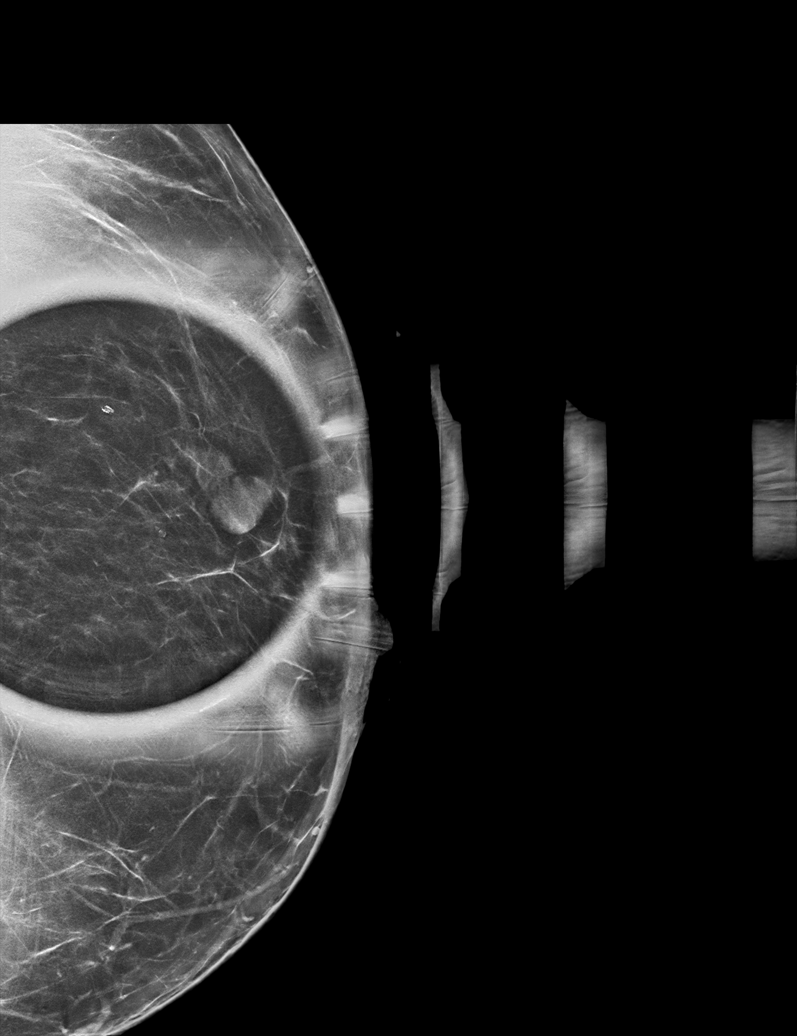

[R ML synth-2D]
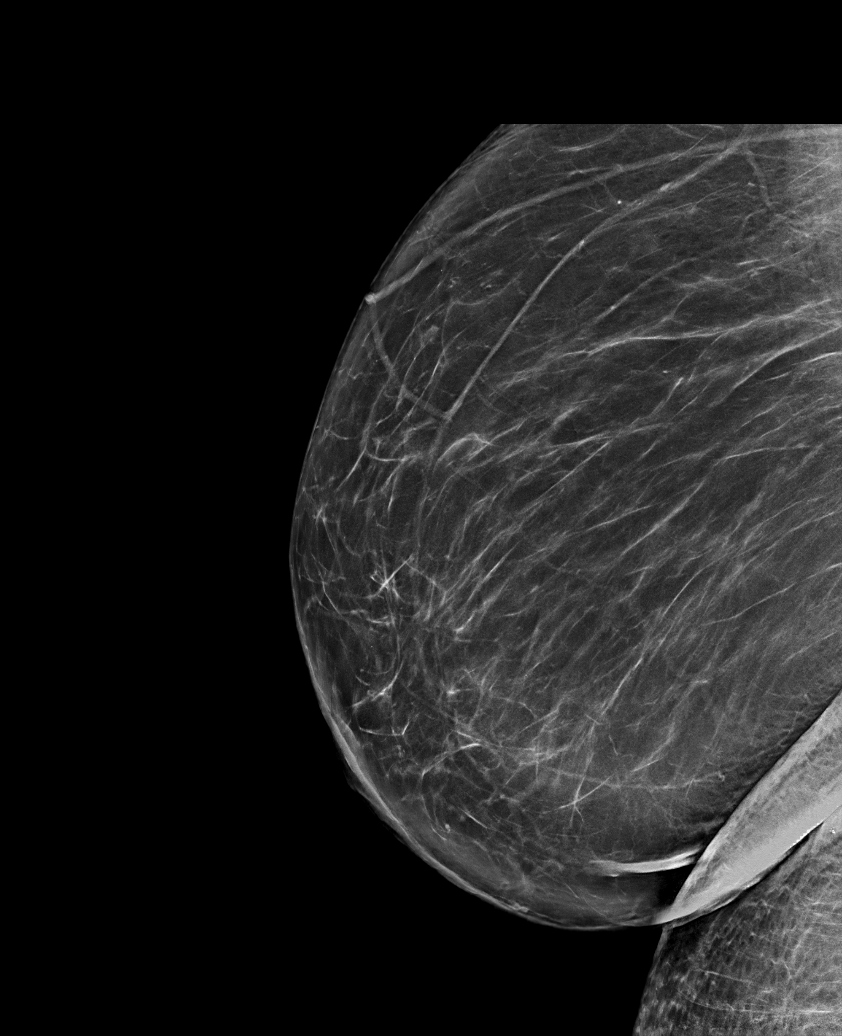

[L CC synth-2D]
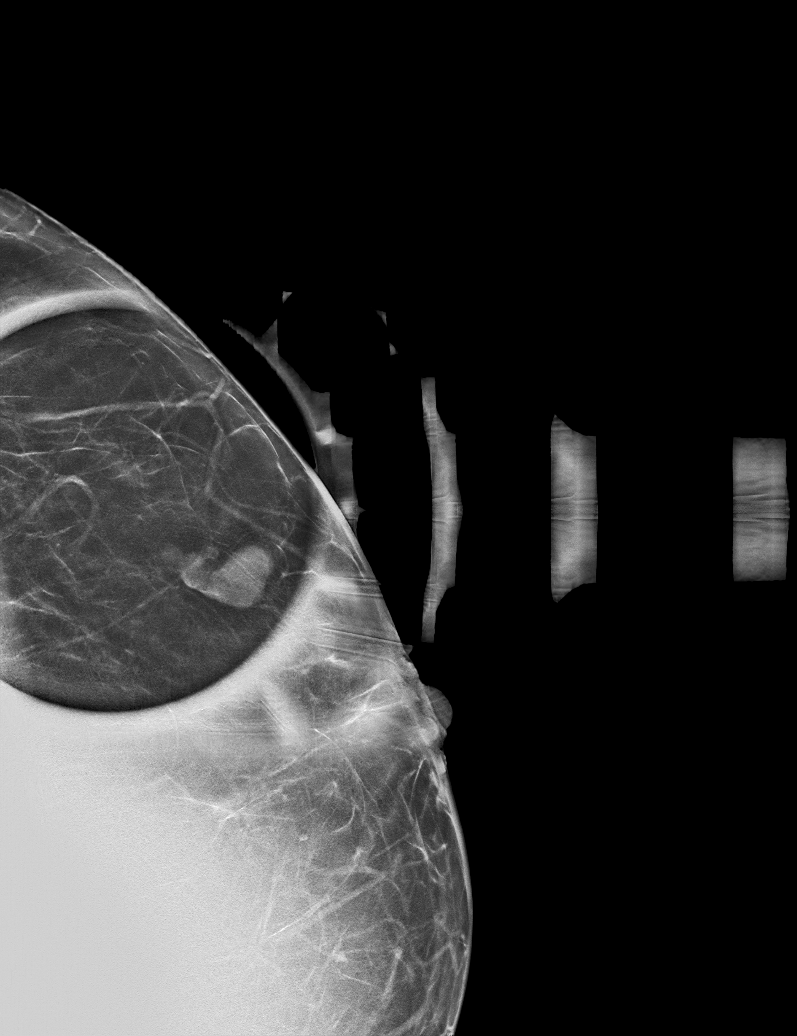

[R CC synth-2D (1 of 2)]
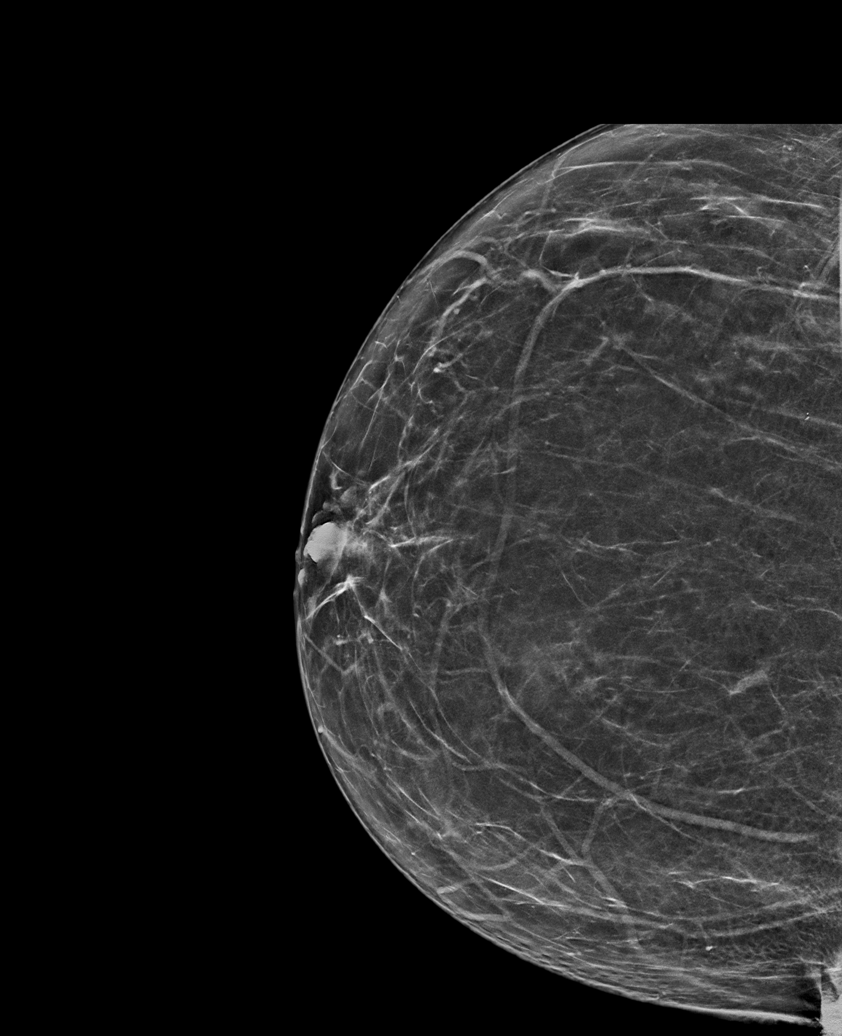

[R CC synth-2D (2 of 2)]
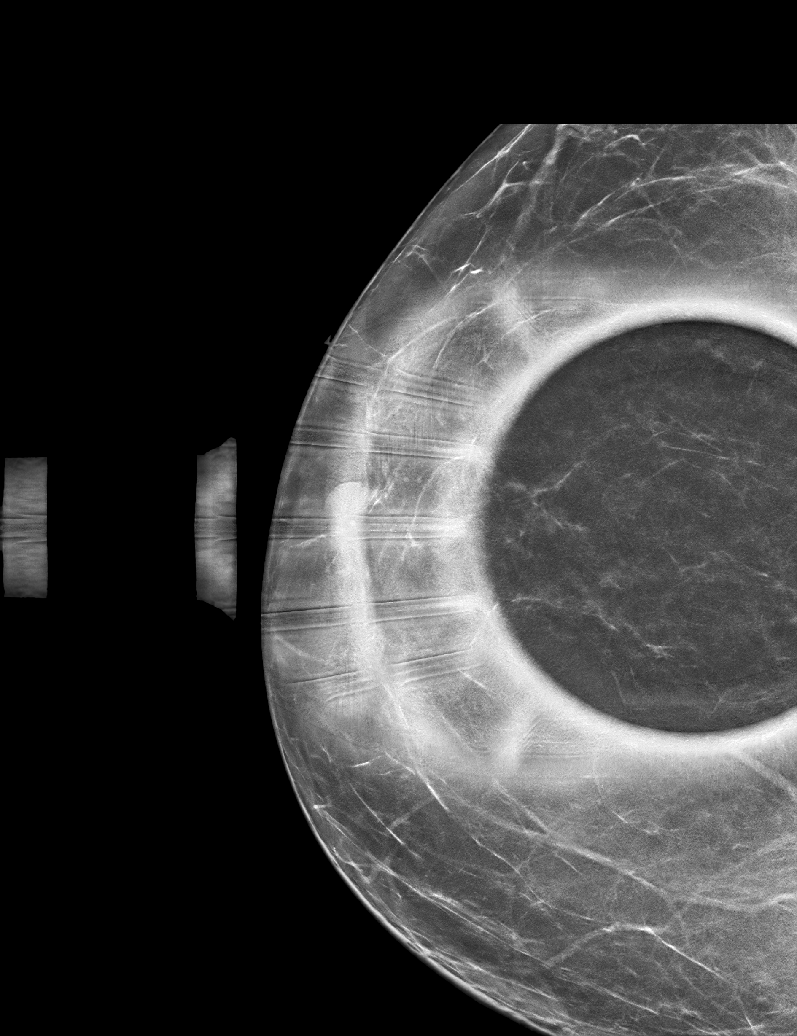

[5 of 30 positions shown; findings below may reference images not displayed]

No prior ultrasound.

ACR Breast Density Category b: There are scattered areas of
fibroglandular density.
FINDINGS: Spot-compression CC view of the area of concern in the RIGHT breast,
a full field mediolateral view of the RIGHT breast, a medially
rolled full field CC view of the RIGHT breast, and spot compression
CC and MLO views of the area of concern in the LEFT breast were
obtained.

RIGHT: The asymmetry in the central breast disperses with
compression, likely indicating overlapping fibroglandular tissue.
However, the asymmetry persists on the full field medially rolled CC
view where its position is medial relative to its location on the
screening CC view, indicating its location in the inner breast. On
the full field mediolateral view, the asymmetry is in the lower
breast, therefore LOWER INNER QUADRANT.

Targeted ultrasound is performed, demonstrating focal duct ectasia
at the 5 o'clock position blank cm from the nipple which accounts
for the screening mammographic finding. There are no intraductal
masses.

Sonographic evaluation of the RIGHT axilla demonstrates a solitary
lymph node with diffuse cortical thickening up to approximately
cm.

LEFT: Spot compression views confirm an isodense multilobular mass
in the outer breast at anterior depth measuring just over 2 cm in
size. There is no associated architectural distortion or suspicious
calcifications.

Targeted ultrasound is performed, demonstrating an oval parallel
circumscribed heterogeneous though predominantly hypoechoic mass at
the 2 o'clock position 3 cm from the nipple measuring approximately
2.0 x 0.7 x 1.7 cm, demonstrating mixed posterior characteristics
and no internal power Doppler flow, corresponding to the screening
mammographic finding.

Sonographic evaluation of the LEFT axilla demonstrates adjacent
lymph nodes with cortical thickening measuring 0.6 cm and 0.7 cm.
IMPRESSION: 1. Likely benign 2.0 cm mass, likely a fibroadenoma, involving the
UPPER OUTER QUADRANT of the LEFT breast at anterior depth at the 2
o'clock position 3 cm from the nipple which accounts for the
screening mammographic finding.
2. Benign focal duct ectasia involving the LOWER INNER QUADRANT of
the RIGHT breast at middle depth at the 5 o'clock position 1 cm from
the nipple which accounts for the screening mammographic finding.
3. Benign reactive BILATERAL axillary lymph nodes in this patient
with hidradenitis suppurativa.

RECOMMENDATION:
We discussed management options for the likely benign fibroadenoma
including excision, ultrasound-guided core biopsy, and short term
interval follow-up. Follow-up ultrasound is recommended at 6, 12 and
24 months to assess stability. The patient agrees with this plan.

I have discussed the findings and recommendations with the patient.
If applicable, a reminder letter will be sent to the patient
regarding the next appointment.

BI-RADS CATEGORY  3: Probably benign.

## 2023-04-14 ENCOUNTER — Ambulatory Visit
Admission: RE | Admit: 2023-04-14 | Discharge: 2023-04-14 | Disposition: A | Discharge status: Home / Self Care | Payer: BC Managed Care – PPO | Source: Ambulatory Visit | Attending: Emergency Medicine | Admitting: Emergency Medicine

## 2023-04-14 VITALS — BP 150/81 | HR 69 | Temp 98.3°F | Resp 18

## 2023-04-14 DIAGNOSIS — L732 Hidradenitis suppurativa: Secondary | ICD-10-CM | POA: Diagnosis not present

## 2023-04-14 DIAGNOSIS — N611 Abscess of the breast and nipple: Secondary | ICD-10-CM | POA: Diagnosis not present

## 2023-04-14 NOTE — ED Provider Notes (Signed)
Shelia Diaz    CSN: [id number] Arrival date & time: 04/14/23  0957      History   Chief Complaint Chief Complaint  Patient presents with   Abscess    Large cyst on right breast. - Entered by patient    HPI Shelia Diaz is a 42 y.o. female.   Patient presents for evaluation of a cyst to the right breast that is flared over the past week.  Has increased in size and become tender.  Has been holding warm compresses to the affected area.  Site has not begun to drain.  Denies presence of fever.  History of Hydro nidus supportive.  Taking daily doxycycline, increase his dosage from 100 mg to 200 mg during menstruation also taking generic Humira for treatment.  Followed by dermatology.  Past Medical History:  Diagnosis Date   Allergy-induced asthma    Chicken pox    History of anemia     Patient Active Problem List   Diagnosis Date Noted   Essential hypertension 11/11/2022   Acute low back pain with bilateral sciatica 07/25/2021   Routine general medical examination at a health care facility 03/31/2021   Morbid obesity (HCC) 07/29/2015   Hidradenitis suppurativa 07/29/2015   Allergy-induced asthma 07/29/2015    Past Surgical History:  Procedure Laterality Date   LAPAROSCOPIC GASTRIC SLEEVE RESECTION  03/16/2016    OB History   No obstetric history on file.      Home Medications    Prior to Admission medications   Medication Sig Start Date End Date Taking? Authorizing Provider  albuterol (VENTOLIN HFA) 108 (90 Base) MCG/ACT inhaler Inhale 2 puffs into the lungs every 6 (six) hours as needed for wheezing or shortness of breath. 07/09/22   Myrlene Broker, MD  Calcium 500 MG CHEW Chew 1 each 3 (three) times daily by mouth.    [provider]  cetirizine (ZYRTEC) 10 MG tablet Take 10 mg daily as needed by mouth.    [provider]  doxycycline (VIBRAMYCIN) 100 MG capsule Take 1 capsule (100 mg total) by mouth 2 (two) times daily. 10/09/22    Tomi Bamberger, PA-C  Iron Carbonyl-Vitamin C-FOS 30-10-25 MG CHEW Chew by mouth.    [provider]  Multiple Vitamin (MULTIVITAMIN) capsule Take by mouth.    [provider]  Prenat-FeAsp-Meth-FA-DHA w/o A (PRENATE ESSENTIAL) 18-0.6-0.4-300 MG CAPS Take by mouth. 07/12/22   [provider]  senna-docusate (SENOKOT-S) 8.6-50 MG tablet Take by mouth. 05/17/22 05/17/23  [provider]  silver sulfADIAZINE (SILVADENE) 1 % cream  06/04/21   [provider]  simethicone (GAS-X) 80 MG chewable tablet Chew 1 tablet (80 mg total) by mouth every 6 (six) hours as needed for flatulence. 01/03/23   Piontek, Denny Peon, MD  spironolactone (ALDACTONE) 50 MG tablet Take by mouth. 08/05/22 08/05/23  [provider]  terconazole (TERAZOL 3) 0.8 % vaginal cream Place 1 applicator vaginally at bedtime. 02/28/23   Myrlene Broker, MD  vitamin B-12 (CYANOCOBALAMIN) 1000 MCG tablet Take by mouth.    [provider]    Family History Family History  Problem Relation Age of Onset   Heart disease Mother    Stroke Mother    Hypertension Mother    Heart disease Father    Stroke Father    Arthritis Paternal Grandfather     Social History Social History   Tobacco Use   Smoking status: Never   Smokeless tobacco: Never  Vaping Use  Vaping status: Never Used  Substance Use Topics   Alcohol use: No    Alcohol/week: 0.0 standard drinks of alcohol   Drug use: No     Allergies   Penicillins   Review of Systems Review of Systems  Constitutional: Negative.   HENT: Negative.    Respiratory: Negative.    Cardiovascular: Negative.   Gastrointestinal: Negative.   Skin:  Positive for wound. Negative for color change, pallor and rash.     Physical Exam Triage Vital Signs ED Triage Vitals [04/14/23 1008]  Encounter Vitals Group     BP (!) 150/81     Systolic BP Percentile      Diastolic BP Percentile      Pulse Rate 69     Resp 18      Temp 98.3 F (36.8 C)     Temp Source Oral     SpO2 99 %     Weight      Height      Head Circumference      Peak Flow      Pain Score 7     Pain Loc      Pain Education      Exclude from Growth Chart    No data found.  Updated Vital Signs BP (!) 150/81 (BP Location: Left Arm)   Pulse 69   Temp 98.3 F (36.8 C) (Oral)   Resp 18   LMP 04/03/2023 (Approximate)   SpO2 99%   Visual Acuity Right Eye Distance:   Left Eye Distance:   Bilateral Distance:    Right Eye Near:   Left Eye Near:    Bilateral Near:     Physical Exam Constitutional:      Appearance: Normal appearance.  Eyes:     Extraocular Movements: Extraocular movements intact.  Pulmonary:     Effort: Pulmonary effort is normal.  Skin:    Comments: 2 x 3 erythematous and tender abscess present to the right breast at approximately 6:00  Neurological:     Mental Status: She is alert and oriented to person, place, and time. Mental status is at baseline.      UC Treatments / Results  Labs (all labs ordered are listed, but only abnormal results are displayed) Labs Reviewed - No data to display  EKG   Radiology No results found.  Procedures Incision and Drainage  Date/Time: 04/14/2023 10:59 AM  Performed by: Valinda Hoar, NP Authorized by: Valinda Hoar, NP   Consent:    Consent obtained:  Verbal   Consent given by:  Patient   Risks, benefits, and alternatives were discussed: yes     Risks discussed:  Incomplete drainage Universal protocol:    Procedure explained and questions answered to patient or proxy's satisfaction: yes     Patient identity confirmed:  Verbally with patient Location:    Type:  Abscess   Size:  2x3   Location: right breast. Pre-procedure details:    Skin preparation:  Povidone-iodine Sedation:    Sedation type:  None Anesthesia:    Anesthesia method:  Local infiltration   Local anesthetic:  Lidocaine 2% w/o epi Procedure type:    Complexity:   Simple Procedure details:    Incision types:  Single straight   Incision depth:  Dermal   Drainage:  Purulent   Drainage amount:  Copious   Wound treatment:  Wound left open   Packing materials:  None (declined) Post-procedure details:    Procedure completion:  Tolerated  (  including critical care time)  Medications Ordered in UC Medications - No data to display  Initial Impression / Assessment and Plan / UC Course  I have reviewed the triage vital signs and the nursing notes.  Pertinent labs & imaging results that were available during my care of the patient were reviewed by me and considered in my medical decision making (see chart for details).  Abscess of right breast, Hydradenitis pruritus  I&D completed, tolerated well, declined packing at this time, copious purulent fluid expelled at vies continue warm compresses and current treatment for Hydradenitis may follow-up with urgent care, primary doctor or dermatologist for further management and evaluation as needed Final Clinical Impressions(s) / UC Diagnoses   Final diagnoses:  Abscess of right breast  Hidradenitis suppurativa     Discharge Instructions      Abscess has been drained here in the office  Continue taking doxycycline as directed  Hold warm-hot compresses to affected area at least 4 times a day, this helps to facilitate draining, the more the better  Please return for evaluation for increased swelling, increased tenderness or pain, non healing site, or, you begin to have fever or chills       ED Prescriptions   None    PDMP not reviewed this encounter.   Valinda Hoar, NP 04/14/23 1101

## 2023-04-14 NOTE — ED Triage Notes (Signed)
Patient presents to UC for abscess on right breast x 1 week. Hx of HS. Took doxy states no improvement.

## 2023-04-14 NOTE — Discharge Instructions (Addendum)
Abscess has been drained here in the office  Continue taking doxycycline as directed  Hold warm-hot compresses to affected area at least 4 times a day, this helps to facilitate draining, the more the better  Please return for evaluation for increased swelling, increased tenderness or pain, non healing site, or, you begin to have fever or chills

## 2023-04-15 ENCOUNTER — Ambulatory Visit: Payer: BC Managed Care – PPO | Admitting: Internal Medicine

## 2023-04-25 ENCOUNTER — Ambulatory Visit: Payer: BC Managed Care – PPO | Admitting: Internal Medicine

## 2023-06-20 ENCOUNTER — Encounter: Payer: Self-pay | Admitting: Internal Medicine

## 2023-06-20 ENCOUNTER — Ambulatory Visit: Payer: BC Managed Care – PPO | Admitting: Internal Medicine

## 2023-06-20 VITALS — BP 138/64 | HR 61 | Temp 98.6°F | Ht 64.0 in | Wt 245.0 lb

## 2023-06-20 DIAGNOSIS — M25512 Pain in left shoulder: Secondary | ICD-10-CM | POA: Diagnosis not present

## 2023-06-20 MED ORDER — MELOXICAM 15 MG PO TABS
15.0000 mg | ORAL_TABLET | Freq: Every day | ORAL | 0 refills | Status: DC
Start: 2023-06-20 — End: 2023-07-18

## 2023-06-20 NOTE — Progress Notes (Unsigned)
   Subjective:   Patient ID: Shelia Diaz, female    DOB: [birth year], 42 y.o.   MRN: [id number]  HPI Shoulder pain started about 4-5 weeks ago. Does work out and [employer], [employer]. She has cut back some on this without significant improvement. No pain down arm or numbness or weakness. No significant limitation of ROM.  Review of Systems  Constitutional: Negative.   HENT: Negative.    Eyes: Negative.   Respiratory:  Negative for cough, chest tightness and shortness of breath.   Cardiovascular:  Negative for chest pain, palpitations and leg swelling.  Gastrointestinal:  Negative for abdominal distention, abdominal pain, constipation, diarrhea, nausea and vomiting.  Musculoskeletal:  Positive for arthralgias.  Skin: Negative.   Neurological: Negative.   Psychiatric/Behavioral: Negative.      Objective:  Physical Exam Constitutional:      Appearance: [name].  HENT:     Head: Normocephalic and atraumatic.  Cardiovascular:     Rate and Rhythm: Normal rate and regular rhythm.  Pulmonary:     Effort: Pulmonary effort is normal. No respiratory distress.     Breath sounds: Normal breath sounds. No wheezing or rales.  Abdominal:     General: Bowel sounds are normal. There is no distension.     Palpations: Abdomen is soft.     Tenderness: There is no abdominal tenderness. There is no rebound.  Musculoskeletal:        General: Tenderness present.     Cervical back: Normal range of motion.     Comments: Left AC tenderness with close to normal ROM  Skin:    General: Skin is warm and dry.  Neurological:     Mental Status: She is alert and oriented to person, place, and time.     Coordination: Coordination normal.     Vitals:   06/20/23 1520  BP: 138/64  Pulse: 61  Temp: 98.6 F (37 C)  TempSrc: Oral  SpO2: 98%  Weight: 245 lb (111.1 kg)  Height: 5\' 4"  (1.626 m)    Assessment & Plan:

## 2023-06-20 NOTE — Patient Instructions (Signed)
We have sent in meloxicam to use as needed up to daily for the shoulder

## 2023-06-21 DIAGNOSIS — M25512 Pain in left shoulder: Secondary | ICD-10-CM | POA: Insufficient documentation

## 2023-06-21 NOTE — Assessment & Plan Note (Signed)
Rx meloxicam 15 mg daily prn for pain and given stretching exercises. Asked to stop lifting for 1-2 weeks and see if this helps. No signs of tear rotator cuff on exam with near normal ROM.

## 2023-07-06 ENCOUNTER — Encounter: Payer: Self-pay | Admitting: Internal Medicine

## 2023-07-18 ENCOUNTER — Encounter: Payer: Self-pay | Admitting: Internal Medicine

## 2023-07-18 ENCOUNTER — Ambulatory Visit (INDEPENDENT_AMBULATORY_CARE_PROVIDER_SITE_OTHER): Payer: BC Managed Care – PPO | Admitting: Internal Medicine

## 2023-07-18 VITALS — BP 118/76 | HR 64 | Temp 98.5°F | Ht 64.0 in | Wt 244.0 lb

## 2023-07-18 DIAGNOSIS — Z0000 Encounter for general adult medical examination without abnormal findings: Secondary | ICD-10-CM

## 2023-07-18 DIAGNOSIS — G8929 Other chronic pain: Secondary | ICD-10-CM | POA: Diagnosis not present

## 2023-07-18 DIAGNOSIS — M25512 Pain in left shoulder: Secondary | ICD-10-CM | POA: Diagnosis not present

## 2023-07-18 MED ORDER — NYSTATIN-TRIAMCINOLONE 100000-0.1 UNIT/GM-% EX OINT: 1.0000 | TOPICAL_OINTMENT | Freq: Two times a day (BID) | CUTANEOUS | 0 refills | Status: DC

## 2023-07-18 MED ORDER — MELOXICAM 15 MG PO TABS: 15.0000 mg | ORAL_TABLET | Freq: Every day | ORAL | 0 refills | Status: AC

## 2023-07-18 NOTE — Assessment & Plan Note (Signed)
Still having pain and refer to sports medicine. New tingling down the arm at times. Does work out and may have injury causing nerve compression.

## 2023-07-18 NOTE — Assessment & Plan Note (Signed)
Flu shot declines. Tetanus up to date. Mammogram up to date, pap smear up to date. Counseled about sun safety and mole surveillance. Counseled about the dangers of distracted driving. Given 10 year screening recommendations.

## 2023-07-18 NOTE — Progress Notes (Signed)
   Subjective:   Patient ID: Shelia Diaz, female    DOB: 1980/11/15, 42 y.o.   MRN: [id number]  HPI The patient is here for physical.  PMH, [facility], social history reviewed and updated  Review of Systems  Constitutional: Negative.   HENT: Negative.    Eyes: Negative.   Respiratory:  Negative for cough, chest tightness and shortness of breath.   Cardiovascular:  Negative for chest pain, palpitations and leg swelling.  Gastrointestinal:  Negative for abdominal distention, abdominal pain, constipation, diarrhea, nausea and vomiting.  Musculoskeletal:  Positive for arthralgias.  Skin:  Positive for wound.  Neurological: Negative.   Psychiatric/Behavioral: Negative.      Objective:  Physical Exam Constitutional:      Appearance: [name].  HENT:     Head: Normocephalic and atraumatic.  Cardiovascular:     Rate and Rhythm: Normal rate and regular rhythm.  Pulmonary:     Effort: Pulmonary effort is normal. No respiratory distress.     Breath sounds: Normal breath sounds. No wheezing or rales.  Abdominal:     General: Bowel sounds are normal. There is no distension.     Palpations: Abdomen is soft.     Tenderness: There is no abdominal tenderness. There is no rebound.  Musculoskeletal:     Cervical back: Normal range of motion.  Skin:    General: Skin is warm and dry.  Neurological:     Mental Status: She is alert and oriented to person, place, and time.     Coordination: Coordination normal.     Vitals:   07/18/23 0858  BP: 118/76  Pulse: 64  Temp: 98.5 F (36.9 C)  TempSrc: Oral  SpO2: 100%  Weight: 244 lb (110.7 kg)  Height: 5\' 4"  (1.626 m)    Assessment & Plan:

## 2023-07-22 NOTE — Progress Notes (Unsigned)
    [facility] [address] [place] [address] Phone: [phone]   Assessment and Plan:     There are no diagnoses linked to this encounter.  ***   Pertinent previous records reviewed include ***    Follow Up: ***     Subjective:   I, Shelia Diaz, am serving as a Neurosurgeon for Doctor Richardean Sale  Chief Complaint: left shoulder pain   HPI:   07/25/2023 Patient is a 42 year old female with concerns of left shoulder pain. Patient states  Relevant Historical Information: ***  Additional pertinent review of systems negative.   Current Outpatient Medications:    albuterol (VENTOLIN HFA) 108 (90 Base) MCG/ACT inhaler, Inhale 2 puffs into the lungs every 6 (six) hours as needed for wheezing or shortness of breath., Disp: 8 g, Rfl: 0   Calcium 500 MG CHEW, Chew 1 each 3 (three) times daily by mouth., Disp: , Rfl:    cetirizine (ZYRTEC) 10 MG tablet, Take 10 mg daily as needed by mouth., Disp: , Rfl:    doxycycline (VIBRAMYCIN) 100 MG capsule, Take 1 capsule (100 mg total) by mouth 2 (two) times daily., Disp: 20 capsule, Rfl: 0   Iron Carbonyl-Vitamin C-FOS 30-10-25 MG CHEW, Chew by mouth., Disp: , Rfl:    meloxicam (MOBIC) 15 MG tablet, Take 1 tablet (15 mg total) by mouth daily., Disp: 30 tablet, Rfl: 0   Multiple Vitamin (MULTIVITAMIN) capsule, Take by mouth., Disp: , Rfl:    nystatin-triamcinolone ointment (MYCOLOG), Apply 1 Application topically 2 (two) times daily., Disp: 100 g, Rfl: 0   Prenat-FeAsp-Meth-FA-DHA w/o A (PRENATE ESSENTIAL) 18-0.6-0.4-300 MG CAPS, Take by mouth., Disp: , Rfl:    silver sulfADIAZINE (SILVADENE) 1 % cream, , Disp: , Rfl:    simethicone (GAS-X) 80 MG chewable tablet, Chew 1 tablet (80 mg total) by mouth every 6 (six) hours as needed for flatulence., Disp: 30 tablet, Rfl: 0   spironolactone (ALDACTONE) 50 MG tablet, Take by mouth., Disp: , Rfl:    terconazole (TERAZOL 3) 0.8 % vaginal  cream, Place 1 applicator vaginally at bedtime., Disp: 20 g, Rfl: 11   vitamin B-12 (CYANOCOBALAMIN) 1000 MCG tablet, Take by mouth., Disp: , Rfl:    Objective:     There were no vitals filed for this visit.    There is no height or weight on file to calculate BMI.    Physical Exam:    ***   Electronically signed by:  [facility] 7:26 AM 07/22/23

## 2023-07-25 ENCOUNTER — Ambulatory Visit: Payer: BC Managed Care – PPO | Admitting: Sports Medicine

## 2023-07-25 ENCOUNTER — Ambulatory Visit (INDEPENDENT_AMBULATORY_CARE_PROVIDER_SITE_OTHER): Payer: BC Managed Care – PPO

## 2023-07-25 VITALS — BP 132/82 | HR 59 | Ht 64.0 in | Wt 244.0 lb

## 2023-07-25 DIAGNOSIS — M7552 Bursitis of left shoulder: Secondary | ICD-10-CM

## 2023-07-25 DIAGNOSIS — M25512 Pain in left shoulder: Secondary | ICD-10-CM | POA: Diagnosis not present

## 2023-07-25 NOTE — Patient Instructions (Signed)
-   Start meloxicam 15 mg daily x2 weeks.  If still having pain after 2 weeks, complete 3rd-week of meloxicam. May use remaining meloxicam as needed once daily for pain control.  Do not to use additional NSAIDs while taking meloxicam.  May use Tylenol [phone] mg 2 to 3 times a day for breakthrough pain. Shoulder HEP  1 week of rest from upper body workouts -Recommend gradual return to physical activity.  Start activity at 50% (speed, duration, reps, sets, intensity) and allow 24 hours to assess for worsening pain.  If 50% is well-tolerated, may increase next activity to 75%.  If 75% is well-tolerated, may increase next physical activity to 100%.  If any of these levels cause pain, recommend dropping down to previous level for an additional 2-3 attempts before advancing. 3 week follow up

## 2023-08-12 NOTE — Progress Notes (Unsigned)
    Shelia Diaz D.[name] [address] [place] [address] Phone: [phone]   Assessment and Plan:     There are no diagnoses linked to this encounter.  ***   Pertinent previous records reviewed include ***    Follow Up: ***     Subjective:   I, Shelia Diaz, am serving as a Neurosurgeon for Doctor Richardean Sale   Chief Complaint: left shoulder pain    HPI:    07/25/2023 Patient is a 42 year old female with concerns of left shoulder pain. Patient states has doing chest fly, the weight was to high. Notes tingling that radiates down the arm. Decreased ROM. Tylenol and ibu for the pain helps her sleep at night. Pain for about a month +. Decrease in grip strength. Pain and pressure when at rest . Anterior shoulder pain   08/15/2023 Patient states   Relevant Historical Information: Hypertension  Additional pertinent review of systems negative.   Current Outpatient Medications:    albuterol (VENTOLIN HFA) 108 (90 Base) MCG/ACT inhaler, Inhale 2 puffs into the lungs every 6 (six) hours as needed for wheezing or shortness of breath., Disp: 8 g, Rfl: 0   Calcium 500 MG CHEW, Chew 1 each 3 (three) times daily by mouth., Disp: , Rfl:    cetirizine (ZYRTEC) 10 MG tablet, Take 10 mg daily as needed by mouth., Disp: , Rfl:    doxycycline (VIBRAMYCIN) 100 MG capsule, Take 1 capsule (100 mg total) by mouth 2 (two) times daily., Disp: 20 capsule, Rfl: 0   Iron Carbonyl-Vitamin C-FOS 30-10-25 MG CHEW, Chew by mouth., Disp: , Rfl:    meloxicam (MOBIC) 15 MG tablet, Take 1 tablet (15 mg total) by mouth daily., Disp: 30 tablet, Rfl: 0   Multiple Vitamin (MULTIVITAMIN) capsule, Take by mouth., Disp: , Rfl:    nystatin-triamcinolone ointment (MYCOLOG), Apply 1 Application topically 2 (two) times daily., Disp: 100 g, Rfl: 0   Prenat-FeAsp-Meth-FA-DHA w/o A (PRENATE ESSENTIAL) 18-0.6-0.4-300 MG CAPS, Take by mouth., Disp: , Rfl:    silver sulfADIAZINE  (SILVADENE) 1 % cream, , Disp: , Rfl:    simethicone (GAS-X) 80 MG chewable tablet, Chew 1 tablet (80 mg total) by mouth every 6 (six) hours as needed for flatulence., Disp: 30 tablet, Rfl: 0   spironolactone (ALDACTONE) 50 MG tablet, Take by mouth., Disp: , Rfl:    terconazole (TERAZOL 3) 0.8 % vaginal cream, Place 1 applicator vaginally at bedtime., Disp: 20 g, Rfl: 11   vitamin B-12 (CYANOCOBALAMIN) 1000 MCG tablet, Take by mouth., Disp: , Rfl:    Objective:     There were no vitals filed for this visit.    There is no height or weight on file to calculate BMI.    Physical Exam:    ***   Electronically signed by:  Shelia Diaz D.[name] 7:37 AM 08/12/23

## 2023-08-15 ENCOUNTER — Ambulatory Visit: Payer: BC Managed Care – PPO | Admitting: Sports Medicine

## 2023-08-15 VITALS — BP 126/72 | HR 78 | Ht 64.0 in | Wt 247.0 lb

## 2023-08-15 DIAGNOSIS — M7552 Bursitis of left shoulder: Secondary | ICD-10-CM

## 2023-08-15 DIAGNOSIS — M25512 Pain in left shoulder: Secondary | ICD-10-CM

## 2023-09-06 ENCOUNTER — Ambulatory Visit: Payer: BC Managed Care – PPO | Admitting: Internal Medicine

## 2023-09-06 ENCOUNTER — Encounter: Payer: Self-pay | Admitting: Internal Medicine

## 2023-09-06 VITALS — BP 126/80 | HR 73 | Temp 99.0°F | Ht 64.0 in | Wt 242.0 lb

## 2023-09-06 DIAGNOSIS — H938X1 Other specified disorders of right ear: Secondary | ICD-10-CM | POA: Diagnosis not present

## 2023-09-06 DIAGNOSIS — H938X9 Other specified disorders of ear, unspecified ear: Secondary | ICD-10-CM | POA: Insufficient documentation

## 2023-09-06 NOTE — Assessment & Plan Note (Signed)
Suspect more allergic. No signs of infection or sinus infection. Advised to take zyrtec daily for 1 week and then prn as she has been doing.

## 2023-09-06 NOTE — Progress Notes (Signed)
   Subjective:   Patient ID: Shelia Diaz, female    DOB: [birth year], 42 y.o.   MRN: [id number]  HPI The patient is a 42 YO female coming in for ringing in ears and fluid in the ears. Started in the last week or so.   Review of Systems  Constitutional: Negative.   HENT: Negative.         Ear fullness  Eyes: Negative.   Respiratory:  Negative for cough, chest tightness and shortness of breath.   Cardiovascular:  Negative for chest pain, palpitations and leg swelling.  Gastrointestinal:  Negative for abdominal distention, abdominal pain, constipation, diarrhea, nausea and vomiting.  Musculoskeletal: Negative.   Skin: Negative.   Neurological: Negative.   Psychiatric/Behavioral: Negative.      Objective:  Physical Exam Constitutional:      Appearance: [name].  HENT:     Head: Normocephalic and atraumatic.     Comments: Left and right TM normal, minimal redness oropharynx  Cardiovascular:     Rate and Rhythm: Normal rate and regular rhythm.  Pulmonary:     Effort: Pulmonary effort is normal. No respiratory distress.     Breath sounds: Normal breath sounds. No wheezing or rales.  Abdominal:     General: Bowel sounds are normal. There is no distension.     Palpations: Abdomen is soft.     Tenderness: There is no abdominal tenderness. There is no rebound.  Musculoskeletal:     Cervical back: Normal range of motion.  Skin:    General: Skin is warm and dry.  Neurological:     Mental Status: She is alert and oriented to person, place, and time.     Coordination: Coordination normal.     Vitals:   09/06/23 0911  BP: 126/80  Pulse: 73  Temp: 99 F (37.2 C)  TempSrc: Oral  SpO2: 99%  Weight: 242 lb (109.8 kg)  Height: 5' 4 (1.626 m)    Assessment & Plan:  Visit time 15 minutes in face to face communication with patient and coordination of care, additional 5 minutes spent in record review, coordination or care, ordering tests, communicating/referring  to other healthcare professionals, documenting in medical records all on the same day of the visit for total time 20 minutes spent on the visit.

## 2023-10-13 ENCOUNTER — Ambulatory Visit: Payer: 59 | Admitting: Sports Medicine

## 2023-10-13 VITALS — BP 130/70 | HR 71 | Ht 64.0 in

## 2023-10-13 DIAGNOSIS — M7662 Achilles tendinitis, left leg: Secondary | ICD-10-CM

## 2023-10-13 DIAGNOSIS — M79672 Pain in left foot: Secondary | ICD-10-CM | POA: Diagnosis not present

## 2023-10-13 MED ORDER — MELOXICAM 15 MG PO TABS: 15.0000 mg | ORAL_TABLET | Freq: Every day | ORAL | 0 refills | Status: DC

## 2023-10-13 NOTE — Patient Instructions (Addendum)
 Foot HEP.   - Start meloxicam  15 mg daily x2 weeks.  If still having pain after 2 weeks, complete 3rd-week of NSAID. May use remaining NSAID as needed once daily for pain control.  Do not to use additional over-the-counter NSAIDs (ibuprofen , naproxen , Advil , Aleve ) while taking prescription NSAIDs.  May use Tylenol  [phone] mg 2 to 3 times a day for breakthrough pain.  Supportive foot wear and tennis shoes. 4 week follow up.

## 2023-10-13 NOTE — Progress Notes (Signed)
 Ben Vanice Rappa D.[facility] [address] [place] [address] Phone: [phone]   Assessment and Plan:    1. Pain of left heel 2. Achilles tendinitis, left leg  -Acute, initial visit - Most consistent with Achilles tendinitis of left leg, likely flared due to flatfoot wear use when standing for a prolonged time on 10/09/2023 - Start HEP for Achilles tendinitis - Start meloxicam  15 mg daily x2 weeks.  If still having pain after 2 weeks, complete 3rd-week of NSAID. May use remaining NSAID as needed once daily for pain control.  Do not to use additional over-the-counter NSAIDs (ibuprofen , naproxen , Advil , Aleve ) while taking prescription NSAIDs.  May use Tylenol  [phone] mg 2 to 3 times a day for breakthrough pain. - Recommend wearing supportive tennis shoes  Pertinent previous records reviewed include none  Follow Up: 4 weeks for reevaluation.  Could consider boot versus physical therapy versus x-ray based on symptoms   Subjective:   I, Moenique Parris, am serving as a neurosurgeon for Doctor Morene Mace   Chief Complaint: left foot pain    HPI:    10/13/2023 Patient is a 43 year old female with left foot pain. Patient states Sunday night woke up ut of her sleep pain shooting thru heel, hurt to walk, took tylenol  got a little better but once she got up for the day it is constant pain. On and off but after laying down for 3 to 4 hours it hurts. Tried icing it, but once the chill is gone the pain returns. No injury that she knows.     Relevant Historical Information: Hypertension  Additional pertinent review of systems negative.   Current Outpatient Medications:    meloxicam  (MOBIC ) 15 MG tablet, Take 1 tablet (15 mg total) by mouth daily., Disp: 30 tablet, Rfl: 0   albuterol  (VENTOLIN  HFA) 108 (90 Base) MCG/ACT inhaler, Inhale 2 puffs into the lungs every 6 (six) hours as needed for wheezing or shortness of breath., Disp: 8 g, Rfl: 0   Calcium  500 MG CHEW, Chew 1 each 3 (three) times daily by mouth., Disp: , Rfl:    cetirizine (ZYRTEC) 10 MG tablet, Take 10 mg daily as needed by mouth., Disp: , Rfl:    doxycycline  (VIBRAMYCIN ) 100 MG capsule, Take 1 capsule (100 mg total) by mouth 2 (two) times daily., Disp: 20 capsule, Rfl: 0   Iron Carbonyl-Vitamin C-FOS 30-10-25 MG CHEW, Chew by mouth., Disp: , Rfl:    meloxicam  (MOBIC ) 15 MG tablet, Take 1 tablet (15 mg total) by mouth daily., Disp: 30 tablet, Rfl: 0   Multiple Vitamin (MULTIVITAMIN) capsule, Take by mouth., Disp: , Rfl:    nystatin -triamcinolone  ointment (MYCOLOG), Apply 1 Application topically 2 (two) times daily., Disp: 100 g, Rfl: 0   Prenat-FeAsp-Meth-FA-DHA w/o A (PRENATE ESSENTIAL) 18-0.6-0.4-300 MG CAPS, Take by mouth., Disp: , Rfl:    silver sulfADIAZINE (SILVADENE) 1 % cream, , Disp: , Rfl:    simethicone  (GAS-X) 80 MG chewable tablet, Chew 1 tablet (80 mg total) by mouth every 6 (six) hours as needed for flatulence., Disp: 30 tablet, Rfl: 0   spironolactone (ALDACTONE) 50 MG tablet, Take by mouth., Disp: , Rfl:    terconazole  (TERAZOL 3 ) 0.8 % vaginal cream, Place 1 applicator vaginally at bedtime., Disp: 20 g, Rfl: 11   vitamin B-12 (CYANOCOBALAMIN) 1000 MCG tablet, Take by mouth., Disp: , Rfl:    Objective:     Vitals:   10/13/23 1512  BP:  130/70  Pulse: 71  SpO2: 99%  Height: 5' 4 (1.626 m)      Body mass index is 41.54 kg/m.    Physical Exam:    Gen: Appears well, nad, nontoxic and pleasant Psych: Alert and oriented, appropriate mood and affect Neuro: sensation intact, strength is 5/5 with df/pf/inv/ev, muscle tone wnl Skin: no susupicious lesions or rashes  Left foot/ankle:  No deformity, no swelling or effusion TTP Achilles, posterior calcaneus NTTP over fibular head, lat mal, medial mal, plantar fascia, navicular, base of 5th, ATFL, CFL, deltoid,   or midfoot ROM DF 30, PF 45, inv/ev intact Negative ant drawer, talar tilt, rotation test,  squeeze test. Neg thompson No pain with resisted inversion or eversion  Pain with single-leg toe raise Pain along Achilles tendon with calf stretch  Electronically signed by:  Odis Mace D.[facility] 3:32 PM 10/13/23

## 2023-10-24 ENCOUNTER — Encounter: Payer: Self-pay | Admitting: Internal Medicine

## 2023-10-24 ENCOUNTER — Ambulatory Visit: Payer: 59 | Admitting: Internal Medicine

## 2023-10-24 DIAGNOSIS — N6002 Solitary cyst of left breast: Secondary | ICD-10-CM

## 2023-11-07 ENCOUNTER — Ambulatory Visit
Admission: RE | Admit: 2023-11-07 | Discharge: 2023-11-07 | Disposition: A | Discharge status: Home / Self Care | Payer: 59 | Source: Ambulatory Visit | Attending: Internal Medicine | Admitting: Internal Medicine

## 2023-11-07 DIAGNOSIS — N6002 Solitary cyst of left breast: Secondary | ICD-10-CM

## 2023-11-08 ENCOUNTER — Encounter: Payer: Self-pay | Admitting: Internal Medicine

## 2023-11-09 ENCOUNTER — Encounter: Payer: Self-pay | Admitting: Internal Medicine

## 2023-11-09 LAB — HM MAMMOGRAPHY

## 2023-11-09 NOTE — Progress Notes (Signed)
 Shelia Diaz D.[name] [address] [place] [address] Phone: [phone]   Assessment and Plan:     1. Pain of left heel (Primary) 2. Achilles tendinitis, left leg -Subacute, subsequent visit - Significant improvement in Achilles tendinitis after completing 2-week meloxicam course, using heel lifts in flat shoes, starting HEP - Continue HEP as needed for pain flares - Discontinue meloxicam 50 mg daily.  May use remaining meloxicam 15 mg daily as needed for breakthrough pain.  Recommend limiting chronic NSAIDs to 1-2 doses per week.  Can call for refill if needed  3.  Chronic pain of left shoulder 4. Subacromial bursitis of left shoulder joint  - Chronic with exacerbation, subsequent visit - Overall improvement in left shoulder, however patient continues to have intermittent pain and decreased strength in left shoulder with weight lifting activities.  Still most consistent with continued subacromial bursitis versus rotator cuff strain - May use meloxicam 15 mg daily as needed.  Recommend limiting chronic NSAIDs to 1-2 doses per week - Continue HEP for shoulder - Offered subacromial CSI.  Patient declined at today's visit, but could be considered a follow-up visit if no improvement    Pertinent previous records reviewed include none  Follow Up: As needed if no improvement or worsening of symptoms.  Could consider subacromial CSI versus physical therapy   Subjective:   I, Shelia Diaz, am serving as a Neurosurgeon for Doctor Richardean Sale   Chief Complaint: left foot pain    HPI:    10/13/2023 Patient is a 43 year old female with left foot pain. Patient states Sunday night woke up ut of her sleep pain shooting thru heel, hurt to walk, took tylenol got a little better but once she got up for the day it is constant pain. On and off but after laying down for 3 to 4 hours it hurts. Tried icing it, but once the chill is gone the pain returns.  No injury that she knows.    11/14/2023 Patient states that she is much better    Relevant Historical Information: Hypertension  Additional pertinent review of systems negative.   Current Outpatient Medications:    albuterol (VENTOLIN HFA) 108 (90 Base) MCG/ACT inhaler, Inhale 2 puffs into the lungs every 6 (six) hours as needed for wheezing or shortness of breath., Disp: 8 g, Rfl: 0   Calcium 500 MG CHEW, Chew 1 each 3 (three) times daily by mouth., Disp: , Rfl:    cetirizine (ZYRTEC) 10 MG tablet, Take 10 mg daily as needed by mouth., Disp: , Rfl:    doxycycline (VIBRAMYCIN) 100 MG capsule, Take 1 capsule (100 mg total) by mouth 2 (two) times daily., Disp: 20 capsule, Rfl: 0   Iron Carbonyl-Vitamin C-FOS 30-10-25 MG CHEW, Chew by mouth., Disp: , Rfl:    meloxicam (MOBIC) 15 MG tablet, Take 1 tablet (15 mg total) by mouth daily., Disp: 30 tablet, Rfl: 0   Multiple Vitamin (MULTIVITAMIN) capsule, Take by mouth., Disp: , Rfl:    nystatin-triamcinolone ointment (MYCOLOG), Apply 1 Application topically 2 (two) times daily., Disp: 100 g, Rfl: 0   Prenat-FeAsp-Meth-FA-DHA w/o A (PRENATE ESSENTIAL) 18-0.6-0.4-300 MG CAPS, Take by mouth., Disp: , Rfl:    silver sulfADIAZINE (SILVADENE) 1 % cream, , Disp: , Rfl:    simethicone (GAS-X) 80 MG chewable tablet, Chew 1 tablet (80 mg total) by mouth every 6 (six) hours as needed for flatulence., Disp: 30 tablet, Rfl: 0   terconazole (TERAZOL  3) 0.8 % vaginal cream, Place 1 applicator vaginally at bedtime., Disp: 20 g, Rfl: 11   vitamin B-12 (CYANOCOBALAMIN) 1000 MCG tablet, Take by mouth., Disp: , Rfl:    meloxicam (MOBIC) 15 MG tablet, Take 1 tablet (15 mg total) by mouth daily., Disp: 30 tablet, Rfl: 0   spironolactone (ALDACTONE) 50 MG tablet, Take by mouth., Disp: , Rfl:    Objective:     Vitals:   11/14/23 1533  BP: 130/76  Pulse: 62  SpO2: 100%  Weight: 242 lb (109.8 kg)  Height: 5\' 4"  (1.626 m)      Body mass index is 41.54 kg/m.     Physical Exam:     Gen: Appears well, nad, nontoxic and pleasant Neuro:sensation intact, strength is 5/5 with df/pf/inv/ev, muscle tone wnl Skin: no suspicious lesion or defmority Psych: A&O, appropriate mood and affect  Left shoulder:  No deformity, swelling or muscle wasting No scapular winging FF 180, abd 180, int 0, ext 90 NTTP over the [place], clavicle, ac, coracoid, biceps groove, humerus, deltoid, trapezius, cervical spine Positive Neer, Hawkins, empty can, O'Brien Neg  crossarm, subscap liftoff, speeds Neg ant drawer, sulcus sign, apprehension Negative Spurling's test bilat FROM of neck    Electronically signed by:  Shelia Diaz D.[name] 3:48 PM 11/14/23

## 2023-11-14 ENCOUNTER — Ambulatory Visit: Payer: 59 | Admitting: Sports Medicine

## 2023-11-14 VITALS — BP 130/76 | HR 62 | Ht 64.0 in | Wt 242.0 lb

## 2023-11-14 DIAGNOSIS — M7662 Achilles tendinitis, left leg: Secondary | ICD-10-CM | POA: Diagnosis not present

## 2023-11-14 DIAGNOSIS — M79672 Pain in left foot: Secondary | ICD-10-CM

## 2023-11-14 DIAGNOSIS — M7552 Bursitis of left shoulder: Secondary | ICD-10-CM | POA: Diagnosis not present

## 2023-11-14 DIAGNOSIS — M25512 Pain in left shoulder: Secondary | ICD-10-CM

## 2023-11-14 DIAGNOSIS — G8929 Other chronic pain: Secondary | ICD-10-CM

## 2023-11-14 NOTE — Patient Instructions (Signed)
 Discontinue meloxicam use remainder as needed limit to 1-2 times per week  Continue shoulder HEP  Call and let us know if you are no better in 1 month

## 2023-11-21 ENCOUNTER — Ambulatory Visit: Payer: 59 | Admitting: Internal Medicine

## 2023-11-29 ENCOUNTER — Ambulatory Visit
Admission: RE | Admit: 2023-11-29 | Discharge: 2023-11-29 | Disposition: A | Discharge status: Home / Self Care | Source: Ambulatory Visit | Attending: Emergency Medicine | Admitting: Emergency Medicine

## 2023-11-29 DIAGNOSIS — N611 Abscess of the breast and nipple: Secondary | ICD-10-CM

## 2023-11-29 MED ORDER — ACETAMINOPHEN-CODEINE 300-30 MG PO TABS: 1.0000 | ORAL_TABLET | Freq: Four times a day (QID) | ORAL | 0 refills | Status: DC | PRN

## 2023-11-29 NOTE — Discharge Instructions (Addendum)
 Abscess has been drained  Continue daily medications as directed  May take Tylenol and Motrin as needed for pain, for severe pain may use Tylenol 3, be mindful this has codeine in it and can make you feel drowsy  Hold warm-hot compresses to affected area at least 4 times a day, this helps to facilitate draining, the more the better  Please return for evaluation for increased swelling, increased tenderness or pain, non healing site, non draining site, you begin to have fever or chills

## 2023-11-29 NOTE — ED Triage Notes (Addendum)
 Patient presents tp UC for abscess on left breast and several smaller ones on stomach area noted 4 days ago. Hx of HS. Treating with warm compress, states on antibiotics.

## 2023-11-29 NOTE — ED Provider Notes (Signed)
 [facility]    CSN: [id number] Arrival date & time: 11/29/23  1510      History   Chief Complaint Chief Complaint  Patient presents with   Abscess    I have a few cyst that have now connected to make a large one on my breast and stomach. - Entered by patient    HPI Shelia Diaz is a 43 y.o. female.   Patient presents for abscess underneath the left breast present for 4 days.  Initially began as multiple abscesses but progressively came together this morning.  Has been treating with warm compresses.  History of hidradenitis, taking daily spironolactone, doxycycline, followed by dermatology.  Past Medical History:  Diagnosis Date   Allergy-induced asthma    Chicken pox    History of anemia     Patient Active Problem List   Diagnosis Date Noted   Ear fullness 09/06/2023   Left shoulder pain 06/21/2023   Essential hypertension 11/11/2022   Acute low back pain with bilateral sciatica 07/25/2021   Routine general medical examination at a health care facility 03/31/2021   Morbid obesity (HCC) 07/29/2015   Hidradenitis suppurativa 07/29/2015   Allergy-induced asthma 07/29/2015    Past Surgical History:  Procedure Laterality Date   LAPAROSCOPIC GASTRIC SLEEVE RESECTION  03/16/2016    OB History   No obstetric history on file.      Home Medications    Prior to Admission medications   Medication Sig Start Date End Date Taking? Authorizing Provider  acetaminophen-codeine (TYLENOL #3) 300-30 MG tablet Take 1 tablet by mouth every 6 (six) hours as needed for moderate pain (pain score 4-6). 11/29/23  Yes Channelle Bottger R, NP  albuterol (VENTOLIN HFA) 108 (90 Base) MCG/ACT inhaler Inhale 2 puffs into the lungs every 6 (six) hours as needed for wheezing or shortness of breath. 07/09/22   Myrlene Broker, MD  Calcium 500 MG CHEW Chew 1 each 3 (three) times daily by mouth.    [provider]  cetirizine (ZYRTEC) 10 MG tablet Take 10 mg daily as  needed by mouth.    [provider]  doxycycline (VIBRAMYCIN) 100 MG capsule Take 1 capsule (100 mg total) by mouth 2 (two) times daily. 10/09/22   Tomi Bamberger, PA-C  Iron Carbonyl-Vitamin C-FOS 30-10-25 MG CHEW Chew by mouth.    [provider]  meloxicam (MOBIC) 15 MG tablet Take 1 tablet (15 mg total) by mouth daily. 07/18/23   Myrlene Broker, MD  meloxicam (MOBIC) 15 MG tablet Take 1 tablet (15 mg total) by mouth daily. 10/13/23   Richardean Sale, DO  Multiple Vitamin (MULTIVITAMIN) capsule Take by mouth.    [provider]  nystatin-triamcinolone ointment (MYCOLOG) Apply 1 Application topically 2 (two) times daily. 07/18/23   Myrlene Broker, MD  Prenat-FeAsp-Meth-FA-DHA w/o A (PRENATE ESSENTIAL) 18-0.6-0.4-300 MG CAPS Take by mouth. 07/12/22   [provider]  silver sulfADIAZINE (SILVADENE) 1 % cream  06/04/21   [provider]  simethicone (GAS-X) 80 MG chewable tablet Chew 1 tablet (80 mg total) by mouth every 6 (six) hours as needed for flatulence. 01/03/23   Piontek, Denny Peon, MD  spironolactone (ALDACTONE) 50 MG tablet Take by mouth. 08/05/22 08/05/23  [provider]  terconazole (TERAZOL 3) 0.8 % vaginal cream Place 1 applicator vaginally at bedtime. 02/28/23   Myrlene Broker, MD  vitamin B-12 (CYANOCOBALAMIN) 1000 MCG tablet Take by mouth.    [provider]    [facility]  History Family History  Problem Relation Age of Onset   Heart disease Mother    Stroke Mother    Hypertension Mother    Heart disease Father    Stroke Father    Arthritis Paternal Grandfather     Social History Social History   Tobacco Use   Smoking status: Never   Smokeless tobacco: Never  Vaping Use   Vaping status: Never Used  Substance Use Topics   Alcohol use: No    Alcohol/week: 0.0 standard drinks of alcohol   Drug use: No     Allergies   Penicillins   Review of Systems Review of Systems   Physical  Exam Triage Vital Signs ED Triage Vitals [11/29/23 1527]  Encounter Vitals Group     BP      Systolic BP Percentile      Diastolic BP Percentile      Pulse      Resp      Temp      Temp src      SpO2      Weight      Height      Head Circumference      Peak Flow      Pain Score 7     Pain Loc      Pain Education      Exclude from Growth Chart    No data found.  Updated Vital Signs LMP 10/31/2023   Visual Acuity Right Eye Distance:   Left Eye Distance:   Bilateral Distance:    Right Eye Near:   Left Eye Near:    Bilateral Near:     Physical Exam Constitutional:      Appearance: Normal appearance.  Eyes:     Extraocular Movements: Extraocular movements intact.  Pulmonary:     Effort: Pulmonary effort is normal.  Skin:    Comments: 3 x 4 abscess present at the left breast  Neurological:     Mental Status: She is alert and oriented to person, place, and time. Mental status is at baseline.      UC Treatments / Results  Labs (all labs ordered are listed, but only abnormal results are displayed) Labs Reviewed - No data to display  EKG   Radiology No results found.  Procedures Incision and Drainage  Date/Time: 11/29/2023 4:45 PM  Performed by: Valinda Hoar, NP Authorized by: Valinda Hoar, NP   Consent:    Consent obtained:  Verbal   Consent given by:  Patient   Risks, benefits, and alternatives were discussed: yes     Risks discussed:  Incomplete drainage Universal protocol:    Procedure explained and questions answered to patient or proxy's satisfaction: yes     Patient identity confirmed:  Verbally with patient Location:    Type:  Abscess   Size:  3x4   Location:  Trunk   Trunk location:  L breast Pre-procedure details:    Skin preparation:  Chlorhexidine with alcohol Anesthesia:    Anesthesia method:  Local infiltration   Local anesthetic:  Lidocaine 2% w/o epi Procedure type:    Complexity:  Simple Procedure details:     Ultrasound guidance: no     Needle aspiration: no     Incision types:  Single straight   Drainage:  Purulent   Drainage amount:  Copious   Wound treatment:  Wound left open   Packing materials:  None Post-procedure details:    Procedure completion:  Tolerated  (including critical  care time)  Medications Ordered in UC Medications - No data to display  Initial Impression / Assessment and Plan / UC Course  I have reviewed the triage vital signs and the nursing notes.  Pertinent labs & imaging results that were available during my care of the patient were reviewed by me and considered in my medical decision making (see chart for details).  Abscess of left breast  I&D completed, tolerated well, advised continuation of daily medications, Tylenol 3 prescribed for severe pain, PDMP reviewed, low risk, advised supportive care and advised follow-up if symptoms persist or worsen Final Clinical Impressions(s) / UC Diagnoses   Final diagnoses:  Abscess of left breast     Discharge Instructions      Abscess has been drained  Continue daily medications as directed  May take Tylenol and Motrin as needed for pain, for severe pain may use Tylenol 3, be mindful this has codeine in it and can make you feel drowsy  Hold warm-hot compresses to affected area at least 4 times a day, this helps to facilitate draining, the more the better  Please return for evaluation for increased swelling, increased tenderness or pain, non healing site, non draining site, you begin to have fever or chills     ED Prescriptions     Medication Sig Dispense Auth. Provider   acetaminophen-codeine (TYLENOL #3) 300-30 MG tablet Take 1 tablet by mouth every 6 (six) hours as needed for moderate pain (pain score 4-6). 12 tablet Kashauna Celmer, Elita Boone, NP      I have reviewed the PDMP during this encounter.   Valinda Hoar, [place] 11/29/23 [phone]

## 2023-12-02 ENCOUNTER — Other Ambulatory Visit: Payer: 59

## 2023-12-05 ENCOUNTER — Ambulatory Visit: Admitting: Internal Medicine

## 2024-01-16 ENCOUNTER — Ambulatory Visit: Admitting: Internal Medicine

## 2024-01-24 ENCOUNTER — Ambulatory Visit: Admitting: Internal Medicine

## 2024-01-31 ENCOUNTER — Encounter: Payer: Self-pay | Admitting: Internal Medicine

## 2024-01-31 ENCOUNTER — Ambulatory Visit (INDEPENDENT_AMBULATORY_CARE_PROVIDER_SITE_OTHER): Admitting: Internal Medicine

## 2024-01-31 VITALS — BP 136/88 | HR 56 | Temp 98.3°F | Ht 64.0 in | Wt 244.0 lb

## 2024-01-31 DIAGNOSIS — B3789 Other sites of candidiasis: Secondary | ICD-10-CM | POA: Insufficient documentation

## 2024-01-31 MED ORDER — NYSTATIN-TRIAMCINOLONE 100000-0.1 UNIT/GM-% EX OINT: 1.0000 | TOPICAL_OINTMENT | Freq: Two times a day (BID) | CUTANEOUS | 0 refills | Status: AC

## 2024-01-31 MED ORDER — FLUCONAZOLE 150 MG PO TABS: 150.0000 mg | ORAL_TABLET | ORAL | 0 refills | Status: AC

## 2024-01-31 NOTE — Assessment & Plan Note (Signed)
 Rx diflucan  2 week course and rx nystatin /triamcinolone  to use as well.

## 2024-01-31 NOTE — Progress Notes (Signed)
   Subjective:   Patient ID: Shelia Diaz, female    DOB: 07-27-1981, 43 y.o.   MRN: [id number]  HPI The patient is a 43 YO female coming in for itching and irritation on the labia. No vaginal discharge and not currently sexually active no risk for STI. This has been present for some time and flaring recently. Has taken diflucan  in the past with relief.   Review of Systems  Constitutional: Negative.   HENT: Negative.    Eyes: Negative.   Respiratory:  Negative for cough, chest tightness and shortness of breath.   Cardiovascular:  Negative for chest pain, palpitations and leg swelling.  Gastrointestinal:  Negative for abdominal distention, abdominal pain, constipation, diarrhea, nausea and vomiting.  Genitourinary:        Itching  Musculoskeletal: Negative.   Skin: Negative.   Neurological: Negative.   Psychiatric/Behavioral: Negative.      Objective:  Physical Exam Constitutional:      Appearance: [name].  HENT:     Head: Normocephalic and atraumatic.  Cardiovascular:     Rate and Rhythm: Normal rate.  Pulmonary:     Effort: Pulmonary effort is normal. No respiratory distress.  Abdominal:     General: There is no distension.  Musculoskeletal:     Cervical back: Normal range of motion.  Skin:    General: Skin is warm and dry.  Neurological:     Mental Status: She is alert and oriented to person, place, and time.     Coordination: Coordination normal.     Vitals:   01/31/24 1529  BP: 136/88  Pulse: (!) 56  Temp: 98.3 F (36.8 C)  TempSrc: Oral  SpO2: 98%  Weight: 244 lb (110.7 kg)  Height: 5\' 4"  (1.626 m)    Assessment & Plan:

## 2024-01-31 NOTE — Patient Instructions (Signed)
 We will send in the diflucan  to take 1 pill every 3 days for 2 weeks.  We have also sent in nystatin /triamcinolone  cream to use

## 2024-03-08 ENCOUNTER — Encounter: Payer: Self-pay | Admitting: Internal Medicine

## 2024-03-10 ENCOUNTER — Ambulatory Visit
Admission: RE | Admit: 2024-03-10 | Discharge: 2024-03-10 | Disposition: A | Discharge status: Home / Self Care | Source: Ambulatory Visit

## 2024-03-10 VITALS — BP 125/74 | HR 57 | Temp 98.7°F | Resp 18

## 2024-03-10 DIAGNOSIS — N611 Abscess of the breast and nipple: Secondary | ICD-10-CM | POA: Diagnosis present

## 2024-03-10 MED ORDER — DICLOFENAC SODIUM 50 MG PO TBEC: 50.0000 mg | DELAYED_RELEASE_TABLET | Freq: Two times a day (BID) | ORAL | 1 refills | Status: DC

## 2024-03-10 MED ORDER — TRIPLE ANTIBIOTIC 3.5-400-5000 EX OINT
1.0000 | TOPICAL_OINTMENT | Freq: Once | CUTANEOUS | Status: AC
  Administered 2024-03-10: 1 via CUTANEOUS

## 2024-03-10 NOTE — Discharge Instructions (Addendum)
  1. Left breast abscess (Primary) - diclofenac  (VOLTAREN ) 50 MG EC tablet; Take 1 tablet (50 mg total) by mouth 2 (two) times daily.  Dispense: 30 tablet; Refill: 1 - neomycin -bacitracin -polymyxin [phone] OINT 1 Application - Wound care (Clean Wound) cleaned with Betadine prior to procedure - Apply dressing placed over site after abscess I&D procedure, nonadherent gauze, triple antibiotic ointment and tape placed over site for protection and secondary infection prevention. - Aerobic Culture w Gram Stain (superficial specimen) collected during procedure and sent to lab for further testing results should be available in 2 to 3 days - Incision and Drainage completed by provider in UC, copious mucopurulent drainage noted from site, patient reports severe relief of pain after procedure completed. - Patient advised to continue taking doxycycline  200 mg daily as directed by dermatologist for control of hidradenitis suppurativa. -Continue to monitor symptoms for any change in severity if there is any escalation of current symptoms or development of new symptoms follow-up in ER for further evaluation and management.

## 2024-03-10 NOTE — ED Provider Notes (Signed)
 [facility]  Note:  This document was prepared using Conservation officer, historic buildings and may include unintentional dictation errors.  MRN: [id number] DOB: 1980-11-23  Subjective:   Shelia Diaz is a 43 y.o. female presenting for a large abscess under her left breast that is very painful.  Patient reports that she was seen by dermatologist on Monday and injected with steroid into the cyst but states that symptoms have now gotten worse and she is having 9 out of 10 pain.  Patient is unable to sleep.  She has been taking Tylenol  with minimal improvement to symptoms.  Placing warm compresses over the area of discomfort with minimal improvement.  Patient reports she has a long history of abscesses and has been on doxycycline  therapy 100 mg twice daily for quite some time but states sometimes medication is not enough and abscesses become worse.  Patient is currently under the care of a dermatologist but states that they are not in the office again until Monday.  No current facility-administered medications for this encounter.  Current Outpatient Medications:    diclofenac  (VOLTAREN ) 50 MG EC tablet, Take 1 tablet (50 mg total) by mouth 2 (two) times daily., Disp: 30 tablet, Rfl: 1   acetaminophen -codeine  (TYLENOL  #3) 300-30 MG tablet, Take 1 tablet by mouth every 6 (six) hours as needed for moderate pain (pain score 4-6). (Patient not taking: Reported on 01/31/2024), Disp: 12 tablet, Rfl: 0   albuterol  (VENTOLIN  HFA) 108 (90 Base) MCG/ACT inhaler, Inhale 2 puffs into the lungs every 6 (six) hours as needed for wheezing or shortness of breath., Disp: 8 g, Rfl: 0   Calcium 500 MG CHEW, Chew 1 each 3 (three) times daily by mouth., Disp: , Rfl:    cetirizine (ZYRTEC) 10 MG tablet, Take 10 mg daily as needed by mouth., Disp: , Rfl:    doxycycline  (VIBRAMYCIN ) 100 MG capsule, Take 1 capsule (100 mg total) by mouth 2 (two) times daily., Disp: 20 capsule, Rfl: 0   Iron Carbonyl-Vitamin C-FOS  30-10-25 MG CHEW, Chew by mouth., Disp: , Rfl:    meloxicam  (MOBIC ) 15 MG tablet, Take 1 tablet (15 mg total) by mouth daily., Disp: 30 tablet, Rfl: 0   meloxicam  (MOBIC ) 15 MG tablet, Take 1 tablet (15 mg total) by mouth daily., Disp: 30 tablet, Rfl: 0   Multiple Vitamin (MULTIVITAMIN) capsule, Take by mouth., Disp: , Rfl:    nystatin -triamcinolone  ointment (MYCOLOG), Apply 1 Application topically 2 (two) times daily., Disp: 100 g, Rfl: 0   phentermine 15 MG capsule, Take 15 mg by mouth daily., Disp: , Rfl:    Prenat-FeAsp-Meth-FA-DHA w/o A (PRENATE ESSENTIAL) 18-0.6-0.4-300 MG CAPS, Take by mouth. (Patient not taking: Reported on 01/31/2024), Disp: , Rfl:    silver sulfADIAZINE (SILVADENE) 1 % cream, , Disp: , Rfl:    simethicone  (GAS-X) 80 MG chewable tablet, Chew 1 tablet (80 mg total) by mouth every 6 (six) hours as needed for flatulence. (Patient not taking: Reported on 01/31/2024), Disp: 30 tablet, Rfl: 0   spironolactone (ALDACTONE) 100 MG tablet, Take 100 mg by mouth daily., Disp: , Rfl:    spironolactone (ALDACTONE) 50 MG tablet, Take by mouth., Disp: , Rfl:    terconazole  (TERAZOL 3 ) 0.8 % vaginal cream, Place 1 applicator vaginally at bedtime. (Patient not taking: Reported on 01/31/2024), Disp: 20 g, Rfl: 11   vitamin B-12 (CYANOCOBALAMIN) 1000 MCG tablet, Take by mouth., Disp: , Rfl:    Allergies  Allergen Reactions   Penicillins Hives  Past Medical History:  Diagnosis Date   Allergy-induced asthma    Chicken pox    History of anemia      Past Surgical History:  Procedure Laterality Date   LAPAROSCOPIC GASTRIC SLEEVE RESECTION  03/16/2016    Family History  Problem Relation Age of Onset   Heart disease Mother    Stroke Mother    Hypertension Mother    Heart disease Father    Stroke Father    Arthritis Paternal Grandfather     Social History   Tobacco Use   Smoking status: Never   Smokeless tobacco: Never  Vaping Use   Vaping status: Never Used  Substance  Use Topics   Alcohol use: No    Alcohol/week: 0.0 standard drinks of alcohol   Drug use: No    ROS Refer to HPI for ROS details.  Objective:   Vitals: BP 125/74 (BP Location: Left Arm)   Pulse (!) 57   Temp 98.7 F (37.1 C) (Oral)   Resp 18   LMP 02/22/2024   SpO2 98%   Physical Exam Vitals and nursing note reviewed.  Constitutional:      General: She is not in acute distress.    Appearance: Normal appearance. She is not ill-appearing.  HENT:     Head: Normocephalic.  Cardiovascular:     Rate and Rhythm: Normal rate.  Pulmonary:     Effort: Pulmonary effort is normal. No respiratory distress.  Skin:    General: Skin is warm and dry.     Capillary Refill: Capillary refill takes less than 2 seconds.     Findings: Abscess and erythema present.  Neurological:     General: No focal deficit present.     Mental Status: She is alert and oriented to person, place, and time.  Psychiatric:        Mood and Affect: Mood normal.        Behavior: Behavior normal.     Incision and Drainage  Date/Time: 03/10/2024 1:05 PM  Performed by: Aurea Ethel NOVAK, NP Authorized by: Aurea Ethel NOVAK, NP   Consent:    Consent obtained:  Verbal   Consent given by:  Patient   Risks discussed:  Bleeding, pain and infection Universal protocol:    Patient identity confirmed:  Verbally with patient Location:    Type:  Abscess   Location:  Trunk   Trunk location:  L breast Pre-procedure details:    Skin preparation:  Povidone-iodine Anesthesia:    Anesthesia method:  Local infiltration   Local anesthetic:  Lidocaine 1% WITH epi Procedure type:    Complexity:  Complex Procedure details:    Incision types:  Single straight   Incision depth:  Subcutaneous   Wound management:  Probed and deloculated and irrigated with saline   Drainage:  Purulent   Drainage amount:  Copious   Wound treatment:  Wound left open   Packing materials:  None Post-procedure details:    Procedure  completion:  Tolerated well, no immediate complications   No results found for this or any previous visit (from the past 24 hours).  No results found.   Assessment and Plan :     Discharge Instructions       1. Left breast abscess (Primary) - diclofenac  (VOLTAREN ) 50 MG EC tablet; Take 1 tablet (50 mg total) by mouth 2 (two) times daily.  Dispense: 30 tablet; Refill: 1 - neomycin -bacitracin -polymyxin [phone] OINT 1 Application - Wound care (Clean Wound) cleaned with Betadine prior  to procedure - Apply dressing placed over site after abscess I&D procedure, nonadherent gauze, triple antibiotic ointment and tape placed over site for protection and secondary infection prevention. - Aerobic Culture w Gram Stain (superficial specimen) collected during procedure and sent to lab for further testing results should be available in 2 to 3 days - Incision and Drainage completed by provider in UC, copious mucopurulent drainage noted from site, patient reports severe relief of pain after procedure completed. - Patient advised to continue taking doxycycline  200 mg daily as directed by dermatologist for control of hidradenitis suppurativa. -Continue to monitor symptoms for any change in severity if there is any escalation of current symptoms or development of new symptoms follow-up in ER for further evaluation and management.      Meilyn Heindl B Breeonna Mone   Kamarius Buckbee, [place] B, [place] 03/10/24 1352

## 2024-03-10 NOTE — ED Triage Notes (Signed)
  Patient complains of having a large cyst under left breast and a small one under the right, The large one is very painful. So you saw dermatologist on Monday and they injected cyst with steroids and it has gotten worse. Rates pain 9/10. Has taken Tylenol  at 6 am and has also used warm compress.

## 2024-03-13 ENCOUNTER — Ambulatory Visit (HOSPITAL_COMMUNITY): Payer: Self-pay

## 2024-03-13 LAB — AEROBIC CULTURE W GRAM STAIN (SUPERFICIAL SPECIMEN): Gram Stain: NONE SEEN

## 2024-03-13 MED ORDER — SULFAMETHOXAZOLE-TRIMETHOPRIM 800-160 MG PO TABS: 1.0000 | ORAL_TABLET | Freq: Two times a day (BID) | ORAL | 0 refills | Status: AC

## 2024-03-23 ENCOUNTER — Encounter: Payer: Self-pay | Admitting: Advanced Practice Midwife

## 2024-07-20 ENCOUNTER — Ambulatory Visit: Admitting: Internal Medicine

## 2024-07-20 ENCOUNTER — Ambulatory Visit: Payer: Self-pay | Admitting: Internal Medicine

## 2024-07-20 ENCOUNTER — Encounter: Payer: Self-pay | Admitting: Internal Medicine

## 2024-07-20 VITALS — BP 110/60 | HR 54 | Temp 98.0°F | Ht 64.0 in | Wt 238.0 lb

## 2024-07-20 DIAGNOSIS — R1032 Left lower quadrant pain: Secondary | ICD-10-CM | POA: Diagnosis not present

## 2024-07-20 DIAGNOSIS — N9489 Other specified conditions associated with female genital organs and menstrual cycle: Secondary | ICD-10-CM

## 2024-07-20 LAB — CBC
HCT: 41.3 % (ref 36.0–46.0)
Hemoglobin: 13.4 g/dL (ref 12.0–15.0)
MCHC: 32.4 g/dL (ref 30.0–36.0)
MCV: 88.7 fl (ref 78.0–100.0)
Platelets: 276 K/uL (ref 150.0–400.0)
RBC: 4.65 Mil/uL (ref 3.87–5.11)
RDW: 13.6 % (ref 11.5–15.5)
WBC: 5.5 K/uL (ref 4.0–10.5)

## 2024-07-20 LAB — COMPREHENSIVE METABOLIC PANEL WITH GFR
ALT: 17 U/L (ref 0–35)
AST: 20 U/L (ref 0–37)
Albumin: 3.9 g/dL (ref 3.5–5.2)
Alkaline Phosphatase: 80 U/L (ref 39–117)
BUN: 10 mg/dL (ref 6–23)
CO2: 25 meq/L (ref 19–32)
Calcium: 8.6 mg/dL (ref 8.4–10.5)
Chloride: 102 meq/L (ref 96–112)
Creatinine, Ser: 0.68 mg/dL (ref 0.40–1.20)
GFR: 106.07 mL/min (ref >60.00)
Glucose, Bld: 85 mg/dL (ref 70–99)
Potassium: 4 meq/L (ref 3.5–5.1)
Sodium: 133 meq/L — ABNORMAL LOW (ref 135–145)
Total Bilirubin: 0.4 mg/dL (ref 0.2–1.2)
Total Protein: 7.6 g/dL (ref 6.0–8.3)

## 2024-07-20 LAB — LIPASE: Lipase: 18 U/L (ref 11.0–59.0)

## 2024-07-20 MED ORDER — METRONIDAZOLE 500 MG PO TABS: 500.0000 mg | ORAL_TABLET | Freq: Three times a day (TID) | ORAL | 0 refills | Status: AC

## 2024-07-20 MED ORDER — CIPROFLOXACIN HCL 500 MG PO TABS: 500.0000 mg | ORAL_TABLET | Freq: Two times a day (BID) | ORAL | 0 refills | Status: AC

## 2024-07-20 NOTE — Progress Notes (Signed)
 Subjective:   Patient ID: Shelia Diaz, female    DOB: [birth year], 43 y.o.   MRN: [id number]  Discussed the use of AI scribe software for clinical note transcription with the patient, who gave verbal consent to proceed.  History of Present Illness Shelia Diaz is a 43 year old female who presents with severe abdominal pain and nausea.  She experiences severe abdominal pain that began suddenly, waking her from sleep. Initially, the pain was rated as 9-10 out of 10 and has since decreased to 7-8 out of 10. It is described as a pressure that worsens with movement, such as bending or twisting, and she has been unable to find a comfortable position to alleviate it. She has been taking Tylenol , which provides minimal relief, and Gas-X, which has not significantly improved her symptoms. No constipation or difficulty urinating, but she describes pressure and discomfort when urinating. No blood in urine or stool.  She reports nausea and a fear of eating due to the potential for worsening her symptoms. She has been drinking water but has not eaten since the previous night. No vomiting, but she feels queasy and is cautious about consuming food. No fever, chills, or bloating, although she notes feeling colder than usual, which she attributes to the weather. Her last meal was around 8:30-9:00 PM the previous night, and she has not eaten since due to her symptoms.  Review of Systems  Constitutional:  Positive for activity change.  HENT: Negative.    Eyes: Negative.   Respiratory:  Negative for cough, chest tightness and shortness of breath.   Cardiovascular:  Negative for chest pain, palpitations and leg swelling.  Gastrointestinal:  Positive for abdominal pain and nausea. Negative for abdominal distention, constipation, diarrhea and vomiting.  Musculoskeletal: Negative.   Skin: Negative.   Neurological: Negative.   Psychiatric/Behavioral: Negative.      Objective:  Physical  Exam Constitutional:      Appearance: [name].  HENT:     Head: Normocephalic and atraumatic.  Cardiovascular:     Rate and Rhythm: Normal rate and regular rhythm.  Pulmonary:     Effort: Pulmonary effort is normal. No respiratory distress.     Breath sounds: Normal breath sounds. No wheezing or rales.  Abdominal:     General: Bowel sounds are normal. There is no distension.     Palpations: Abdomen is soft.     Tenderness: There is abdominal tenderness.     Comments: Left lower quadrant tenderness without rebound or guarding  Musculoskeletal:     Cervical back: Normal range of motion.  Skin:    General: Skin is warm and dry.  Neurological:     Mental Status: She is alert and oriented to person, place, and time.     Coordination: Coordination normal.     Vitals:   07/20/24 1001  BP: 110/60  Pulse: (!) 54  Temp: 98 F (36.7 C)  TempSrc: Oral  SpO2: 99%  Weight: 238 lb (108 kg)  Height: 5' 4 (1.626 m)    Assessment and Plan Assessment & Plan Left lower quadrant abdominal pain   Acute pain rated 7-8/10, worsens with movement, and is associated with nausea. Differential includes intestinal infection, bacterial infection, diverticulitis, or post-surgical complications. No fever, chills, or hematuria. Pain unlikely due to kidney stones. Ordered CT scan of abdomen to evaluate causes of pain. Obtained blood work for infection markers CBC, CMP, lipase. Prescribed antibiotics for potential bacterial infection, to discontinue if CT scan  negative for infection.

## 2024-07-20 NOTE — Assessment & Plan Note (Signed)
 Acute pain rated 7-8/10, worsens with movement, and is associated with nausea. Differential includes intestinal infection, bacterial infection, diverticulitis, or post-surgical complications. No fever, chills, or hematuria. Pain unlikely due to kidney stones. Ordered CT scan of abdomen to evaluate causes of pain. Obtained blood work for infection markers CBC, CMP, lipase. Prescribed antibiotics for potential bacterial infection, to discontinue if CT scan negative for infection.

## 2024-07-20 NOTE — Patient Instructions (Addendum)
 We will check the labs today and a get a CT scan to check for the cause of the pain.  We have sent in the antibiotics to treat this which is ciprofloxacin  1 pill twice a day. The second one is metronidazole 1 pill 3 times a day. Both will be for 1 week.

## 2024-07-23 ENCOUNTER — Ambulatory Visit
Admission: RE | Admit: 2024-07-23 | Discharge: 2024-07-23 | Disposition: A | Discharge status: Home / Self Care | Source: Ambulatory Visit | Attending: Internal Medicine | Admitting: Internal Medicine

## 2024-07-23 DIAGNOSIS — R1032 Left lower quadrant pain: Secondary | ICD-10-CM

## 2024-07-31 ENCOUNTER — Ambulatory Visit: Admitting: Internal Medicine

## 2024-07-31 VITALS — BP 120/60 | HR 55 | Temp 98.5°F | Ht 64.0 in | Wt 239.0 lb

## 2024-07-31 DIAGNOSIS — I10 Essential (primary) hypertension: Secondary | ICD-10-CM

## 2024-07-31 DIAGNOSIS — Z0000 Encounter for general adult medical examination without abnormal findings: Secondary | ICD-10-CM | POA: Diagnosis not present

## 2024-07-31 DIAGNOSIS — J4520 Mild intermittent asthma, uncomplicated: Secondary | ICD-10-CM | POA: Diagnosis not present

## 2024-07-31 DIAGNOSIS — R1032 Left lower quadrant pain: Secondary | ICD-10-CM

## 2024-07-31 DIAGNOSIS — L732 Hidradenitis suppurativa: Secondary | ICD-10-CM

## 2024-07-31 MED ORDER — SULFAMETHOXAZOLE-TRIMETHOPRIM 800-160 MG PO TABS: 1.0000 | ORAL_TABLET | Freq: Two times a day (BID) | ORAL | 1 refills | Status: DC

## 2024-07-31 MED ORDER — DOXYCYCLINE HYCLATE 100 MG PO CAPS: 100.0000 mg | ORAL_CAPSULE | Freq: Two times a day (BID) | ORAL | 3 refills | Status: AC

## 2024-07-31 NOTE — Patient Instructions (Addendum)
 Voltaren  gel 1% over the counter to try on the knees 3 times a day for 1-2 weeks.  We will get  you a local dermatologist.   We have sent in the doxycycline  and the bactrim .

## 2024-07-31 NOTE — Progress Notes (Unsigned)
   Subjective:   Patient ID: Shelia Diaz, female    DOB: 1981/04/15, 43 y.o.   MRN: [id number]  The patient is here for physical. Pertinent topics discussed: Discussed the use of AI scribe software for clinical note transcription with the patient, who gave verbal consent to proceed.  History of Present Illness Shelia Diaz is a 43 year old female who presents with abdominal pain and concerns about ovarian cysts.  She has experienced significant improvement in her abdominal pain since the last visit, now rating it as 2 or 3 out of 10, which is manageable. The pain is located in the lower abdomen, more on one side. A CT scan showed some dilation in the small intestines.  Her bowel movements have been loose, but she is not constipated and has not needed stool softeners recently. She is concerned about cysts found near her reproductive organs, which could be ovarian cysts or fluid in the fallopian tubes. She experiences pain in the lower abdomen.  She is experiencing knee pain, particularly when doing squats during her Zumba workouts. The pain is located on the kneecap. She has not tried any topical treatments yet.  She has a history of skin flare-ups and is seeking a dermatologist closer to her location due to difficulty in scheduling appointments with her current dermatologist in [place]. She experiences flare-ups around her breasts and under them, which she attributes to sweating during workouts. She has been on Bactrim  and doxycycline  for these flare-ups, taking doxycycline  twice a day and using Bactrim  during flare-ups. Her flare-ups often coincide with her menstrual cycle.  She is eating and drinking well, with no issues of nausea or vomiting. No new chest pains or breathing difficulties.  PMH, [facility], social history reviewed and updated  Review of Systems  Objective:  Physical Exam  There were no vitals filed for this visit.  Assessment & Plan:

## 2024-08-01 ENCOUNTER — Ambulatory Visit
Admission: RE | Admit: 2024-08-01 | Discharge: 2024-08-01 | Disposition: A | Source: Ambulatory Visit | Attending: Internal Medicine | Admitting: Internal Medicine

## 2024-08-01 ENCOUNTER — Encounter: Payer: Self-pay | Admitting: Internal Medicine

## 2024-08-01 DIAGNOSIS — N9489 Other specified conditions associated with female genital organs and menstrual cycle: Secondary | ICD-10-CM

## 2024-08-01 NOTE — Assessment & Plan Note (Signed)
 BP at goal on spironolactone and recent labs without indication for change.

## 2024-08-01 NOTE — Assessment & Plan Note (Signed)
 BMI 41 and improving. Counseled about diet and is currently exercising.

## 2024-08-01 NOTE — Assessment & Plan Note (Signed)
 Uses albuterol  prn and allergy regimen which we will continue. No flare today.

## 2024-08-01 NOTE — Assessment & Plan Note (Signed)
 Needs local dermatologist as hers is currently in [place] and reschedules frequently. She is struggling with access to her medications and suffering from flares as a result of this. Referral done to dermatology and refilled doxycycline  which is her maintenance suppressive antibiotic as well as bactrim  which she uses for flares. She is on injection but likely will have to change due to insurance coverage in new year.

## 2024-08-01 NOTE — Assessment & Plan Note (Signed)
Flu shot up to date. Tetanus up to date. Mammogram with gyn, pap smear with gyn. Counseled about sun safety and mole surveillance. Counseled about the dangers of distracted driving. Given 10 year screening recommendations.

## 2024-08-01 NOTE — Assessment & Plan Note (Signed)
 Somewhat improved. She has contacted weight loss surgeon and they will follow up on dilated small bowel loops to ensure no complications of weight loss procedure. We are following up pelvic cystic structure with US  scheduled for 08/01/24.

## 2024-08-10 ENCOUNTER — Encounter: Payer: Self-pay | Admitting: Internal Medicine

## 2024-08-13 ENCOUNTER — Ambulatory Visit: Payer: Self-pay | Admitting: Internal Medicine

## 2024-08-13 DIAGNOSIS — R1900 Intra-abdominal and pelvic swelling, mass and lump, unspecified site: Secondary | ICD-10-CM

## 2024-08-14 ENCOUNTER — Encounter: Payer: Self-pay | Admitting: Internal Medicine

## 2024-08-27 ENCOUNTER — Inpatient Hospital Stay: Admission: RE | Admit: 2024-08-27 | Discharge: 2024-08-27 | Attending: Internal Medicine | Admitting: Internal Medicine

## 2024-08-27 DIAGNOSIS — R1900 Intra-abdominal and pelvic swelling, mass and lump, unspecified site: Secondary | ICD-10-CM

## 2024-08-27 MED ORDER — GADOPICLENOL 0.5 MMOL/ML IV SOLN
10.0000 mL | Freq: Once | INTRAVENOUS | Status: AC | PRN
  Administered 2024-08-27: 10 mL via INTRAVENOUS

## 2024-09-03 ENCOUNTER — Ambulatory Visit: Payer: Self-pay | Admitting: Internal Medicine

## 2024-09-26 ENCOUNTER — Encounter: Admitting: Obstetrics and Gynecology

## 2024-10-04 ENCOUNTER — Ambulatory Visit (HOSPITAL_BASED_OUTPATIENT_CLINIC_OR_DEPARTMENT_OTHER): Payer: Self-pay | Admitting: Obstetrics & Gynecology

## 2024-10-04 ENCOUNTER — Encounter (HOSPITAL_BASED_OUTPATIENT_CLINIC_OR_DEPARTMENT_OTHER): Payer: Self-pay | Admitting: Obstetrics & Gynecology

## 2024-10-04 VITALS — BP 136/70 | HR 63 | Ht 64.0 in | Wt 238.8 lb

## 2024-10-04 DIAGNOSIS — N926 Irregular menstruation, unspecified: Secondary | ICD-10-CM | POA: Diagnosis not present

## 2024-10-04 DIAGNOSIS — N946 Dysmenorrhea, unspecified: Secondary | ICD-10-CM

## 2024-10-04 DIAGNOSIS — N9489 Other specified conditions associated with female genital organs and menstrual cycle: Secondary | ICD-10-CM

## 2024-10-04 DIAGNOSIS — D252 Subserosal leiomyoma of uterus: Secondary | ICD-10-CM

## 2024-10-04 DIAGNOSIS — L732 Hidradenitis suppurativa: Secondary | ICD-10-CM

## 2024-10-04 DIAGNOSIS — Z1331 Encounter for screening for depression: Secondary | ICD-10-CM

## 2024-10-04 MED ORDER — SULFAMETHOXAZOLE-TRIMETHOPRIM 800-160 MG PO TABS: 1.0000 | ORAL_TABLET | Freq: Two times a day (BID) | ORAL | Status: AC

## 2024-10-04 NOTE — Progress Notes (Signed)
" ° °  GYNECOLOGY  VISIT  CC:   consult regarding adnexal mass from Dr. Rollene   HPI: 44 y.o. G0 Single Black or African American female here for referral for pelvic mass. She reports that she woke up with sharp pain in pelvic area on left side. Imaging was ordered including CT and ultrasound.  Ultrasound 11/226/2025 showed:  1. Cystic mass within the left adnexa containing multiple cystic components measuring approximately 7.6 x 5.2 cm with septations up to 3 mm.  Images reviewed personally.  This appear to be multiple cysts on the left ovary.  No abnormal vascularity noted.  No solid component noted.    Pelvic MRI was then obtained on 08/27/2024 showing  1. Enlarged left ovary secondary to multiple cystic lesions, with largest simple cyst favored to represent follicular cysts. There are smaller cysts with dependent hemorrhagic contents. No suspicious ovarian lesion. No imaging evidence of endometrioma or hydrosalpinx.  Consider follow-up exam after 2 - 3 menstrual cycles to document resolution.  Also noted was a pedunculated 1.4 x 1.9cm fibroid.    Unrelated, pt also just had hiatal hernia repair on 08/23/2024.  Doing well from surgical standpoint.    She has never been SA and has never been pregnant.  She does have worsening menstrual pain over the last year.  Bleeding has become more irregular as well.  Pt has hx of duodenal switch and has lost around 140 pounds total.    LMP: 09/09/2024.  Last pap 07/09/2022 neg with neg HR HPV.  Past Medical History:  Diagnosis Date   Allergy-induced asthma    Chicken pox    History of anemia     MEDS:  Reviewed in EPIC  ALLERGIES: Penicillins  SH:  single, non smoker  Review of Systems  Constitutional: Negative.   Genitourinary:        Painful menstrual cycles Irregular bleeding    PHYSICAL EXAMINATION:    BP 136/70 (BP Location: Right Arm, Patient Position: Sitting, Cuff Size: Normal)   Pulse 63   Ht 5' 4 (1.626 m)   Wt 238 lb  12.8 oz (108.3 kg)   LMP 09/09/2024 (Exact Date)   SpO2 100%   BMI 40.99 kg/m     Physical Exam Constitutional:      Appearance: Normal appearance.  Abdominal:     General: Abdomen is flat. A surgical scar is present. Bowel sounds are normal. There is no distension.     Palpations: Abdomen is soft.     Tenderness: There is no abdominal tenderness.  Skin:    Comments: Furuncles underneath breasts bilaterally  Neurological:     General: No focal deficit present.     Mental Status: She is alert and oriented to person, place, and time.  Psychiatric:        Mood and Affect: Mood normal.        Behavior: Behavior normal.   Pelvic exam deferred.  Assessment/Plan: 1. Adnexal mass (Primary) - have reviewed ultrasound and MRI images personally.  Ovarian/adnexal mass has decreased in size.  Feel repeating ultrasound and checking ca-125 is prudent at this point - CA 125 - US  PELVIS TRANSVAGINAL NON-OB (TV ONLY); Future  2. Hidradenitis suppurativa - on doxycyline and spironolactone   3. Subserous leiomyoma of uterus  4. Dysmenorrhea  5. Irregular menstrual bleeding   "

## 2024-10-05 LAB — CA 125: Cancer Antigen (CA) 125: 15.9 U/mL (ref 0.0–38.1)

## 2024-10-07 ENCOUNTER — Ambulatory Visit (HOSPITAL_BASED_OUTPATIENT_CLINIC_OR_DEPARTMENT_OTHER): Payer: Self-pay | Admitting: Obstetrics & Gynecology

## 2024-10-17 ENCOUNTER — Other Ambulatory Visit (HOSPITAL_BASED_OUTPATIENT_CLINIC_OR_DEPARTMENT_OTHER)

## 2024-10-17 ENCOUNTER — Ambulatory Visit (HOSPITAL_BASED_OUTPATIENT_CLINIC_OR_DEPARTMENT_OTHER): Payer: Self-pay | Admitting: Obstetrics & Gynecology

## 2025-03-13 ENCOUNTER — Ambulatory Visit: Admitting: Physician Assistant
